# Patient Record
Sex: Male | Born: 1955 | Race: White | Hispanic: No | Smoking: Former smoker
Health system: Southern US, Community
[De-identification: ages and names within clinical notes are randomized; demographics above are authoritative.]

## PROBLEM LIST (undated history)

## (undated) DIAGNOSIS — K219 Gastro-esophageal reflux disease without esophagitis: Secondary | ICD-10-CM

## (undated) DIAGNOSIS — J189 Pneumonia, unspecified organism: Secondary | ICD-10-CM

## (undated) DIAGNOSIS — J45909 Unspecified asthma, uncomplicated: Secondary | ICD-10-CM

## (undated) DIAGNOSIS — M1990 Unspecified osteoarthritis, unspecified site: Secondary | ICD-10-CM

## (undated) DIAGNOSIS — E119 Type 2 diabetes mellitus without complications: Secondary | ICD-10-CM

## (undated) DIAGNOSIS — IMO0002 Reserved for concepts with insufficient information to code with codable children: Secondary | ICD-10-CM

## (undated) DIAGNOSIS — I10 Essential (primary) hypertension: Secondary | ICD-10-CM

## (undated) DIAGNOSIS — E1140 Type 2 diabetes mellitus with diabetic neuropathy, unspecified: Secondary | ICD-10-CM

## (undated) DIAGNOSIS — F419 Anxiety disorder, unspecified: Secondary | ICD-10-CM

## (undated) DIAGNOSIS — J449 Chronic obstructive pulmonary disease, unspecified: Secondary | ICD-10-CM

## (undated) DIAGNOSIS — Z8719 Personal history of other diseases of the digestive system: Secondary | ICD-10-CM

## (undated) DIAGNOSIS — F32A Depression, unspecified: Secondary | ICD-10-CM

## (undated) DIAGNOSIS — T7840XA Allergy, unspecified, initial encounter: Secondary | ICD-10-CM

## (undated) DIAGNOSIS — G709 Myoneural disorder, unspecified: Secondary | ICD-10-CM

## (undated) HISTORY — DX: Gastro-esophageal reflux disease without esophagitis: K21.9

## (undated) HISTORY — PX: HERNIA REPAIR: SHX51

## (undated) HISTORY — DX: Allergy, unspecified, initial encounter: T78.40XA

## (undated) HISTORY — PX: NEVUS EXCISION: SHX2090

## (undated) HISTORY — DX: Depression, unspecified: F32.A

## (undated) HISTORY — DX: Unspecified asthma, uncomplicated: J45.909

## (undated) HISTORY — PX: ANKLE SURGERY: SHX546

## (undated) HISTORY — PX: TUBAL LIGATION: SHX77

## (undated) HISTORY — PX: SPINE SURGERY: SHX786

## (undated) HISTORY — PX: APPENDECTOMY: SHX54

## (undated) HISTORY — DX: Chronic obstructive pulmonary disease, unspecified: J44.9

## (undated) HISTORY — DX: Unspecified osteoarthritis, unspecified site: M19.90

## (undated) HISTORY — DX: Anxiety disorder, unspecified: F41.9

## (undated) HISTORY — DX: Reserved for concepts with insufficient information to code with codable children: IMO0002

## (undated) HISTORY — DX: Myoneural disorder, unspecified: G70.9

## (undated) HISTORY — PX: COLON SURGERY: SHX602

## (undated) HISTORY — DX: Type 2 diabetes mellitus without complications: E11.9

## (undated) HISTORY — PX: TOOTH EXTRACTION: SUR596

## (undated) HISTORY — PX: CHOLECYSTECTOMY: SHX55

---

## 1993-04-23 HISTORY — PX: CHOLECYSTECTOMY: SHX55

## 1994-04-23 HISTORY — PX: VASECTOMY: SHX75

## 2001-12-22 HISTORY — PX: SHOULDER SURGERY: SHX246

## 2001-12-22 HISTORY — PX: ROTATOR CUFF REPAIR: SHX139

## 2002-01-21 HISTORY — PX: GANGLION CYST EXCISION: SHX1691

## 2002-07-23 HISTORY — PX: COLONOSCOPY: SHX174

## 2003-04-24 HISTORY — PX: COLON RESECTION: SHX5231

## 2012-09-16 DIAGNOSIS — R3129 Other microscopic hematuria: Secondary | ICD-10-CM | POA: Insufficient documentation

## 2013-12-16 ENCOUNTER — Other Ambulatory Visit: Payer: Self-pay | Admitting: Neurological Surgery

## 2013-12-16 DIAGNOSIS — Z9889 Other specified postprocedural states: Secondary | ICD-10-CM

## 2013-12-16 DIAGNOSIS — M545 Low back pain: Secondary | ICD-10-CM

## 2013-12-16 DIAGNOSIS — M47812 Spondylosis without myelopathy or radiculopathy, cervical region: Secondary | ICD-10-CM

## 2013-12-23 ENCOUNTER — Ambulatory Visit (INDEPENDENT_AMBULATORY_CARE_PROVIDER_SITE_OTHER): Payer: Medicare Other

## 2013-12-23 DIAGNOSIS — M47812 Spondylosis without myelopathy or radiculopathy, cervical region: Secondary | ICD-10-CM

## 2013-12-23 DIAGNOSIS — Z9889 Other specified postprocedural states: Secondary | ICD-10-CM

## 2013-12-23 DIAGNOSIS — M4802 Spinal stenosis, cervical region: Secondary | ICD-10-CM

## 2013-12-23 DIAGNOSIS — M5137 Other intervertebral disc degeneration, lumbosacral region: Secondary | ICD-10-CM

## 2013-12-23 DIAGNOSIS — M5126 Other intervertebral disc displacement, lumbar region: Secondary | ICD-10-CM

## 2013-12-23 DIAGNOSIS — M5380 Other specified dorsopathies, site unspecified: Secondary | ICD-10-CM

## 2013-12-23 DIAGNOSIS — M545 Low back pain: Secondary | ICD-10-CM

## 2014-01-21 HISTORY — PX: NECK SURGERY: SHX720

## 2014-03-09 ENCOUNTER — Other Ambulatory Visit: Payer: Self-pay | Admitting: Neurological Surgery

## 2014-03-09 DIAGNOSIS — M4802 Spinal stenosis, cervical region: Secondary | ICD-10-CM

## 2014-03-23 ENCOUNTER — Ambulatory Visit (INDEPENDENT_AMBULATORY_CARE_PROVIDER_SITE_OTHER): Payer: Medicare Other

## 2014-03-23 DIAGNOSIS — Z981 Arthrodesis status: Secondary | ICD-10-CM

## 2014-03-23 DIAGNOSIS — M4802 Spinal stenosis, cervical region: Secondary | ICD-10-CM

## 2014-06-29 ENCOUNTER — Other Ambulatory Visit: Payer: Self-pay | Admitting: Neurological Surgery

## 2014-06-29 DIAGNOSIS — Z981 Arthrodesis status: Secondary | ICD-10-CM

## 2014-12-04 IMAGING — CR DG CERVICAL SPINE 1V
1 series · 1 of 1 positions shown · non-contrast
Comparison: MR Done of 12/23/2013

CLINICAL DATA: Cervical spine fusion, followup

EXAM:
DG CERVICAL SPINE - 1 VIEW

[view not recorded]
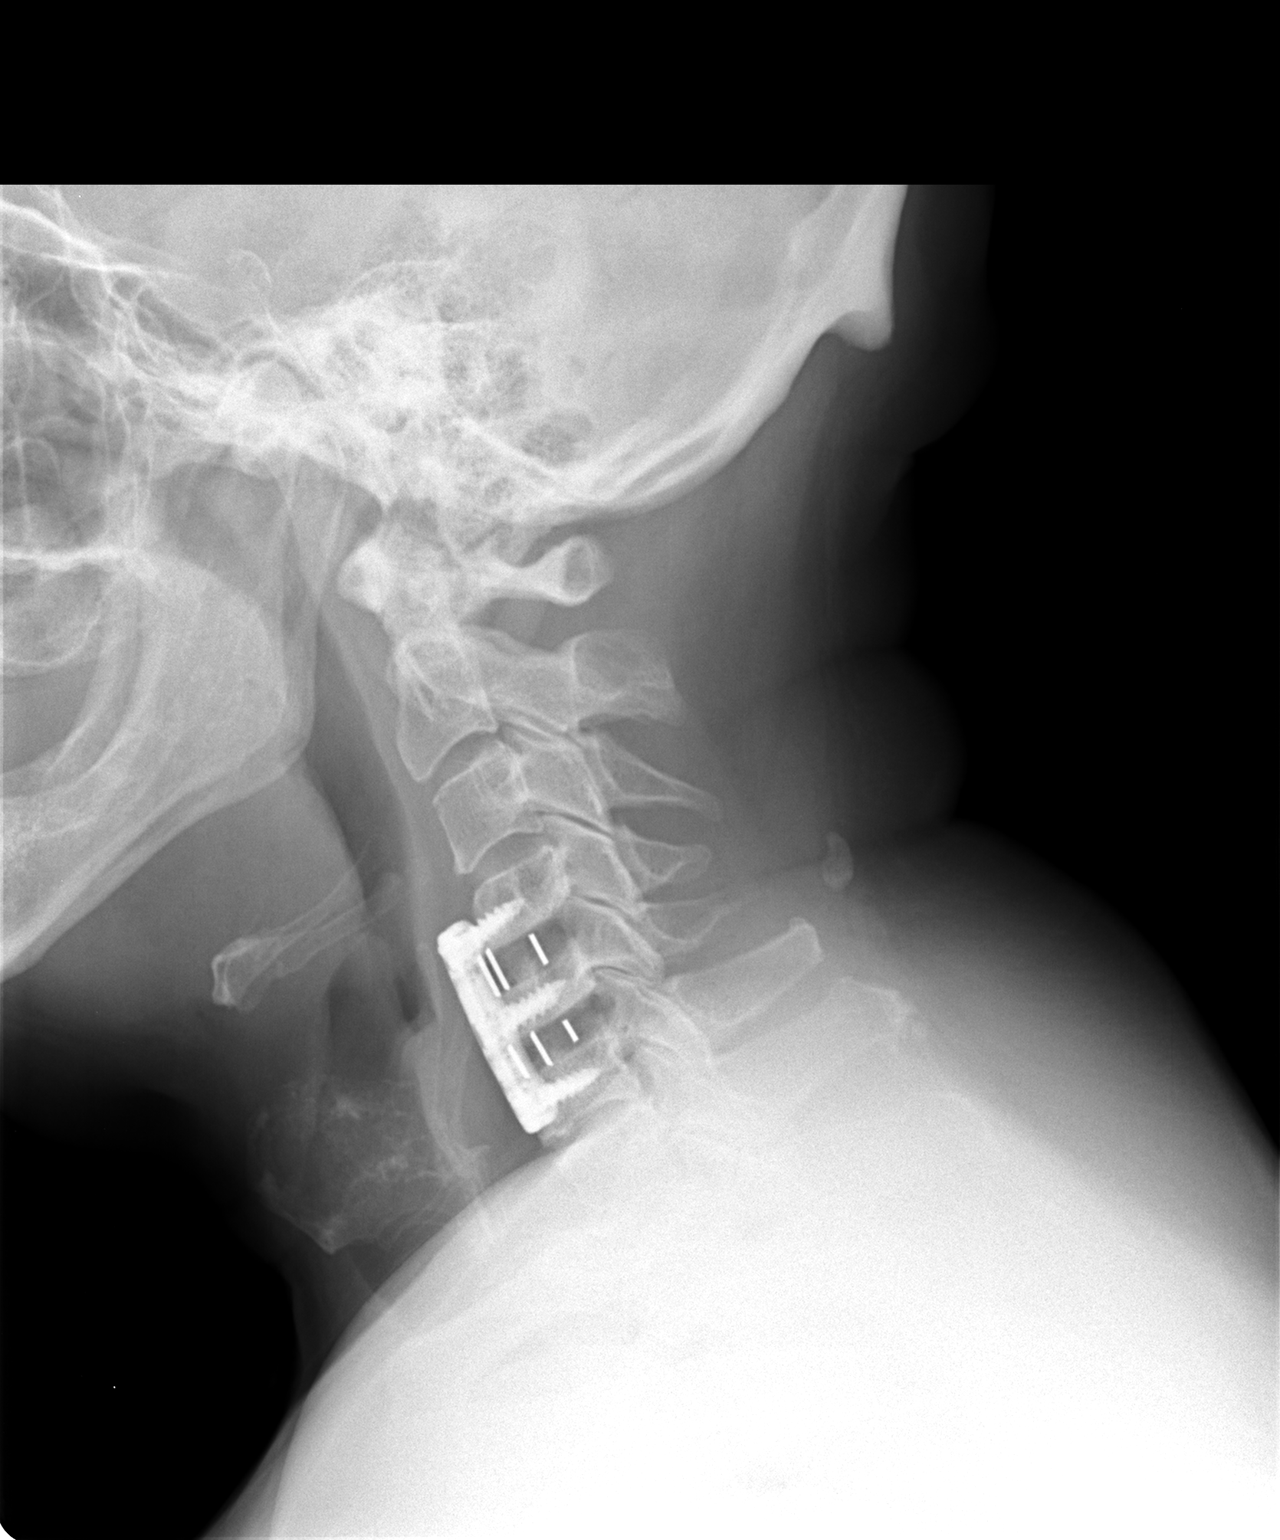

[1 of 1 positions shown; findings below may reference images not displayed]

FINDINGS: The cervical vertebrae are straightened in alignment. Anterior
fusion has been performed at C4-5 and C5-6 with interbody fusion
plugs placed at those levels. No significant prevertebral soft
tissue swelling is seen. There is mild degenerative disc disease at
C6-7.
IMPRESSION: Anterior fusion from C4-C6 with straightened alignment. No acute
abnormality.

## 2016-01-22 HISTORY — PX: CERVICAL FUSION: SHX112

## 2016-02-27 DIAGNOSIS — H18529 Epithelial (juvenile) corneal dystrophy, unspecified eye: Secondary | ICD-10-CM | POA: Insufficient documentation

## 2016-04-23 HISTORY — PX: SPINE SURGERY: SHX786

## 2017-01-21 HISTORY — PX: APPENDECTOMY: SHX54

## 2017-01-29 DIAGNOSIS — K3532 Acute appendicitis with perforation and localized peritonitis, without abscess: Secondary | ICD-10-CM | POA: Insufficient documentation

## 2017-07-03 DIAGNOSIS — S83242A Other tear of medial meniscus, current injury, left knee, initial encounter: Secondary | ICD-10-CM | POA: Insufficient documentation

## 2017-07-22 HISTORY — PX: KNEE ARTHROSCOPY WITH MEDIAL MENISECTOMY: SHX5651

## 2017-07-26 DIAGNOSIS — Z9889 Other specified postprocedural states: Secondary | ICD-10-CM | POA: Insufficient documentation

## 2021-05-31 DIAGNOSIS — D696 Thrombocytopenia, unspecified: Secondary | ICD-10-CM | POA: Diagnosis not present

## 2021-05-31 DIAGNOSIS — E1169 Type 2 diabetes mellitus with other specified complication: Secondary | ICD-10-CM | POA: Diagnosis not present

## 2021-05-31 DIAGNOSIS — E782 Mixed hyperlipidemia: Secondary | ICD-10-CM | POA: Diagnosis not present

## 2021-08-28 DIAGNOSIS — Z888 Allergy status to other drugs, medicaments and biological substances status: Secondary | ICD-10-CM | POA: Diagnosis not present

## 2021-08-28 DIAGNOSIS — Z87891 Personal history of nicotine dependence: Secondary | ICD-10-CM | POA: Diagnosis not present

## 2021-08-28 DIAGNOSIS — K922 Gastrointestinal hemorrhage, unspecified: Secondary | ICD-10-CM | POA: Diagnosis not present

## 2021-08-28 DIAGNOSIS — E1169 Type 2 diabetes mellitus with other specified complication: Secondary | ICD-10-CM | POA: Diagnosis not present

## 2021-08-28 DIAGNOSIS — R7989 Other specified abnormal findings of blood chemistry: Secondary | ICD-10-CM | POA: Diagnosis not present

## 2021-08-28 DIAGNOSIS — R0602 Shortness of breath: Secondary | ICD-10-CM | POA: Diagnosis not present

## 2021-08-28 DIAGNOSIS — Z880 Allergy status to penicillin: Secondary | ICD-10-CM | POA: Diagnosis not present

## 2021-08-28 DIAGNOSIS — K625 Hemorrhage of anus and rectum: Secondary | ICD-10-CM | POA: Diagnosis not present

## 2021-11-08 DIAGNOSIS — E782 Mixed hyperlipidemia: Secondary | ICD-10-CM | POA: Diagnosis not present

## 2021-11-08 DIAGNOSIS — E1169 Type 2 diabetes mellitus with other specified complication: Secondary | ICD-10-CM | POA: Diagnosis not present

## 2021-11-08 DIAGNOSIS — M5442 Lumbago with sciatica, left side: Secondary | ICD-10-CM | POA: Diagnosis not present

## 2021-11-08 DIAGNOSIS — I129 Hypertensive chronic kidney disease with stage 1 through stage 4 chronic kidney disease, or unspecified chronic kidney disease: Secondary | ICD-10-CM | POA: Diagnosis not present

## 2021-11-10 DIAGNOSIS — S61112A Laceration without foreign body of left thumb with damage to nail, initial encounter: Secondary | ICD-10-CM | POA: Diagnosis not present

## 2021-11-10 DIAGNOSIS — Z23 Encounter for immunization: Secondary | ICD-10-CM | POA: Diagnosis not present

## 2021-11-29 DIAGNOSIS — F17211 Nicotine dependence, cigarettes, in remission: Secondary | ICD-10-CM | POA: Diagnosis not present

## 2021-11-29 DIAGNOSIS — J4520 Mild intermittent asthma, uncomplicated: Secondary | ICD-10-CM | POA: Diagnosis not present

## 2021-11-29 DIAGNOSIS — Z0001 Encounter for general adult medical examination with abnormal findings: Secondary | ICD-10-CM | POA: Diagnosis not present

## 2021-11-29 DIAGNOSIS — I129 Hypertensive chronic kidney disease with stage 1 through stage 4 chronic kidney disease, or unspecified chronic kidney disease: Secondary | ICD-10-CM | POA: Diagnosis not present

## 2021-12-01 DIAGNOSIS — Z1211 Encounter for screening for malignant neoplasm of colon: Secondary | ICD-10-CM | POA: Diagnosis not present

## 2021-12-14 DIAGNOSIS — Z87891 Personal history of nicotine dependence: Secondary | ICD-10-CM | POA: Diagnosis not present

## 2022-01-23 DIAGNOSIS — I129 Hypertensive chronic kidney disease with stage 1 through stage 4 chronic kidney disease, or unspecified chronic kidney disease: Secondary | ICD-10-CM | POA: Diagnosis not present

## 2022-01-23 DIAGNOSIS — J4520 Mild intermittent asthma, uncomplicated: Secondary | ICD-10-CM | POA: Diagnosis not present

## 2022-01-23 DIAGNOSIS — J209 Acute bronchitis, unspecified: Secondary | ICD-10-CM | POA: Diagnosis not present

## 2022-01-23 DIAGNOSIS — E1169 Type 2 diabetes mellitus with other specified complication: Secondary | ICD-10-CM | POA: Diagnosis not present

## 2022-01-25 DIAGNOSIS — Z8659 Personal history of other mental and behavioral disorders: Secondary | ICD-10-CM | POA: Diagnosis not present

## 2022-01-25 DIAGNOSIS — Z79899 Other long term (current) drug therapy: Secondary | ICD-10-CM | POA: Diagnosis not present

## 2022-01-25 DIAGNOSIS — R7989 Other specified abnormal findings of blood chemistry: Secondary | ICD-10-CM | POA: Diagnosis not present

## 2022-01-25 DIAGNOSIS — Z8249 Family history of ischemic heart disease and other diseases of the circulatory system: Secondary | ICD-10-CM | POA: Diagnosis not present

## 2022-01-25 DIAGNOSIS — Z87891 Personal history of nicotine dependence: Secondary | ICD-10-CM | POA: Diagnosis not present

## 2022-01-25 DIAGNOSIS — Z8679 Personal history of other diseases of the circulatory system: Secondary | ICD-10-CM | POA: Diagnosis not present

## 2022-01-25 DIAGNOSIS — F410 Panic disorder [episodic paroxysmal anxiety] without agoraphobia: Secondary | ICD-10-CM | POA: Diagnosis not present

## 2022-01-25 DIAGNOSIS — R072 Precordial pain: Secondary | ICD-10-CM | POA: Diagnosis not present

## 2022-01-25 DIAGNOSIS — Z8639 Personal history of other endocrine, nutritional and metabolic disease: Secondary | ICD-10-CM | POA: Diagnosis not present

## 2022-01-27 DIAGNOSIS — R000 Tachycardia, unspecified: Secondary | ICD-10-CM | POA: Diagnosis not present

## 2022-01-27 DIAGNOSIS — R079 Chest pain, unspecified: Secondary | ICD-10-CM | POA: Diagnosis not present

## 2022-01-29 DIAGNOSIS — G629 Polyneuropathy, unspecified: Secondary | ICD-10-CM | POA: Diagnosis not present

## 2022-01-29 DIAGNOSIS — F419 Anxiety disorder, unspecified: Secondary | ICD-10-CM | POA: Diagnosis not present

## 2022-01-29 DIAGNOSIS — F410 Panic disorder [episodic paroxysmal anxiety] without agoraphobia: Secondary | ICD-10-CM | POA: Diagnosis not present

## 2022-01-29 DIAGNOSIS — F32A Depression, unspecified: Secondary | ICD-10-CM | POA: Diagnosis not present

## 2022-03-02 DIAGNOSIS — Z23 Encounter for immunization: Secondary | ICD-10-CM | POA: Diagnosis not present

## 2022-03-02 DIAGNOSIS — R739 Hyperglycemia, unspecified: Secondary | ICD-10-CM | POA: Diagnosis not present

## 2022-03-02 DIAGNOSIS — F419 Anxiety disorder, unspecified: Secondary | ICD-10-CM | POA: Diagnosis not present

## 2022-03-02 DIAGNOSIS — F32A Depression, unspecified: Secondary | ICD-10-CM | POA: Diagnosis not present

## 2022-04-23 HISTORY — PX: INGUINAL HERNIA REPAIR: SHX194

## 2022-04-25 DIAGNOSIS — F32A Depression, unspecified: Secondary | ICD-10-CM | POA: Diagnosis not present

## 2022-04-25 DIAGNOSIS — F419 Anxiety disorder, unspecified: Secondary | ICD-10-CM | POA: Diagnosis not present

## 2022-04-25 DIAGNOSIS — G629 Polyneuropathy, unspecified: Secondary | ICD-10-CM | POA: Diagnosis not present

## 2022-04-25 DIAGNOSIS — K4090 Unilateral inguinal hernia, without obstruction or gangrene, not specified as recurrent: Secondary | ICD-10-CM | POA: Diagnosis not present

## 2022-04-25 DIAGNOSIS — R350 Frequency of micturition: Secondary | ICD-10-CM | POA: Diagnosis not present

## 2022-04-25 DIAGNOSIS — F410 Panic disorder [episodic paroxysmal anxiety] without agoraphobia: Secondary | ICD-10-CM | POA: Diagnosis not present

## 2022-04-25 DIAGNOSIS — R739 Hyperglycemia, unspecified: Secondary | ICD-10-CM | POA: Diagnosis not present

## 2022-05-04 DIAGNOSIS — K4090 Unilateral inguinal hernia, without obstruction or gangrene, not specified as recurrent: Secondary | ICD-10-CM | POA: Diagnosis not present

## 2022-05-04 DIAGNOSIS — K4020 Bilateral inguinal hernia, without obstruction or gangrene, not specified as recurrent: Secondary | ICD-10-CM | POA: Diagnosis not present

## 2022-05-04 DIAGNOSIS — Z9049 Acquired absence of other specified parts of digestive tract: Secondary | ICD-10-CM | POA: Diagnosis not present

## 2022-05-04 DIAGNOSIS — K769 Liver disease, unspecified: Secondary | ICD-10-CM | POA: Diagnosis not present

## 2022-05-15 DIAGNOSIS — K4020 Bilateral inguinal hernia, without obstruction or gangrene, not specified as recurrent: Secondary | ICD-10-CM | POA: Diagnosis not present

## 2022-06-04 DIAGNOSIS — E119 Type 2 diabetes mellitus without complications: Secondary | ICD-10-CM | POA: Diagnosis not present

## 2022-06-04 DIAGNOSIS — K4090 Unilateral inguinal hernia, without obstruction or gangrene, not specified as recurrent: Secondary | ICD-10-CM | POA: Diagnosis not present

## 2022-06-04 DIAGNOSIS — F419 Anxiety disorder, unspecified: Secondary | ICD-10-CM | POA: Diagnosis not present

## 2022-06-04 DIAGNOSIS — J45909 Unspecified asthma, uncomplicated: Secondary | ICD-10-CM | POA: Diagnosis not present

## 2022-06-04 DIAGNOSIS — Z87891 Personal history of nicotine dependence: Secondary | ICD-10-CM | POA: Diagnosis not present

## 2022-06-04 DIAGNOSIS — Z79899 Other long term (current) drug therapy: Secondary | ICD-10-CM | POA: Diagnosis not present

## 2022-06-04 DIAGNOSIS — K4000 Bilateral inguinal hernia, with obstruction, without gangrene, not specified as recurrent: Secondary | ICD-10-CM | POA: Diagnosis not present

## 2022-06-04 DIAGNOSIS — Z880 Allergy status to penicillin: Secondary | ICD-10-CM | POA: Diagnosis not present

## 2022-06-04 DIAGNOSIS — M1990 Unspecified osteoarthritis, unspecified site: Secondary | ICD-10-CM | POA: Diagnosis not present

## 2022-06-04 DIAGNOSIS — E785 Hyperlipidemia, unspecified: Secondary | ICD-10-CM | POA: Diagnosis not present

## 2022-06-04 DIAGNOSIS — K219 Gastro-esophageal reflux disease without esophagitis: Secondary | ICD-10-CM | POA: Diagnosis not present

## 2022-06-04 DIAGNOSIS — Z7984 Long term (current) use of oral hypoglycemic drugs: Secondary | ICD-10-CM | POA: Diagnosis not present

## 2022-06-04 DIAGNOSIS — K4020 Bilateral inguinal hernia, without obstruction or gangrene, not specified as recurrent: Secondary | ICD-10-CM | POA: Diagnosis not present

## 2022-06-04 DIAGNOSIS — Z7982 Long term (current) use of aspirin: Secondary | ICD-10-CM | POA: Diagnosis not present

## 2022-11-09 DIAGNOSIS — L509 Urticaria, unspecified: Secondary | ICD-10-CM | POA: Diagnosis not present

## 2022-11-09 DIAGNOSIS — T7840XA Allergy, unspecified, initial encounter: Secondary | ICD-10-CM | POA: Diagnosis not present

## 2022-12-05 ENCOUNTER — Ambulatory Visit: Payer: Medicare Other | Admitting: Family Medicine

## 2022-12-05 ENCOUNTER — Encounter: Payer: Self-pay | Admitting: Family Medicine

## 2022-12-05 VITALS — BP 149/99 | HR 99 | Resp 18 | Ht 71.0 in | Wt 193.8 lb

## 2022-12-05 DIAGNOSIS — K219 Gastro-esophageal reflux disease without esophagitis: Secondary | ICD-10-CM

## 2022-12-05 DIAGNOSIS — F411 Generalized anxiety disorder: Secondary | ICD-10-CM

## 2022-12-05 DIAGNOSIS — M47816 Spondylosis without myelopathy or radiculopathy, lumbar region: Secondary | ICD-10-CM | POA: Insufficient documentation

## 2022-12-05 DIAGNOSIS — E119 Type 2 diabetes mellitus without complications: Secondary | ICD-10-CM

## 2022-12-05 DIAGNOSIS — M5126 Other intervertebral disc displacement, lumbar region: Secondary | ICD-10-CM

## 2022-12-05 DIAGNOSIS — R062 Wheezing: Secondary | ICD-10-CM | POA: Insufficient documentation

## 2022-12-05 DIAGNOSIS — Z8679 Personal history of other diseases of the circulatory system: Secondary | ICD-10-CM

## 2022-12-05 DIAGNOSIS — E782 Mixed hyperlipidemia: Secondary | ICD-10-CM | POA: Diagnosis not present

## 2022-12-05 DIAGNOSIS — F321 Major depressive disorder, single episode, moderate: Secondary | ICD-10-CM

## 2022-12-05 LAB — POCT GLYCOSYLATED HEMOGLOBIN (HGB A1C): Hemoglobin A1C: 6.3 % — AB (ref 4.0–5.6)

## 2022-12-05 MED ORDER — BUPROPION HCL ER (XL) 150 MG PO TB24: 150.0000 mg | ORAL_TABLET | Freq: Every day | ORAL | 1 refills | Status: DC

## 2022-12-05 NOTE — Assessment & Plan Note (Signed)
-  discussed getting off klonipin and provided pt with taper protocol. Will add wellbutrin to help with the fatigue aspect of depression

## 2022-12-05 NOTE — Assessment & Plan Note (Signed)
-   poc A1c 6.3 will continue metformin 500mg  daily

## 2022-12-05 NOTE — Assessment & Plan Note (Signed)
On omeprazole and doing well

## 2022-12-05 NOTE — Assessment & Plan Note (Signed)
Pt is past smoker - is interested in ct lung cancer screen however would like to discuss more at next visit - wheezing present. Pt has inhalers but does not use them because he is unclear if they actually work.  Have gone ahead and set up patient for nurse visit for spirometry so we can better figure out what his lung function looks like.

## 2022-12-05 NOTE — Progress Notes (Signed)
Established patient visit   Patient: Marcus Brock   DOB: 12-Sep-1955   68 y.o. Male  MRN: [id number] Visit Date: 12/05/2022  Today's healthcare provider: Charlton Amor, DO   Chief Complaint  Patient presents with   New Patient (Initial Visit)    Establish care     SUBJECTIVE    Chief Complaint  Patient presents with   New Patient (Initial Visit)    Establish care    HPI HPI     New Patient (Initial Visit)    Additional comments: Establish care       Last edited by Roselyn Reef, CMA on 12/05/2022  2:44 PM.      Pt presents to establish care. 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  Respiratory:  Negative for cough and shortness of breath.   Cardiovascular:  Negative for chest pain.  Gastrointestinal:  Negative for abdominal pain.  Genitourinary:  Negative for difficulty urinating.       Current Meds  Medication Sig   acetaminophen (TYLENOL) 650 MG CR tablet Take by mouth.   albuterol (VENTOLIN HFA) 108 (90 Base) MCG/ACT inhaler Inhale into the lungs.   ALPRAZolam (XANAX) 1 MG tablet    aspirin EC 81 MG tablet Take by mouth.   atorvastatin (LIPITOR) 40 MG tablet Take 1 tablet by mouth at bedtime.   Azelastine HCl 137 MCG/SPRAY SOLN ONE SPRAY BY BOTH NOSTRILS ROUTE 2 (TWO) TIMES DAILY. USE IN EACH  NOSTRIL AS DIRECTED   buPROPion (WELLBUTRIN XL) 150 MG 24 hr tablet Take 1 tablet (150 mg total) by mouth daily.   Cholecalciferol (VITAMIN D3) 50 MCG (2000 UT) capsule Take by mouth.   clonazePAM (KLONOPIN) 0.5 MG tablet Take by mouth.   Docusate Sodium 100 MG capsule Take by mouth.   DULoxetine (CYMBALTA) 60 MG capsule Take 60 mg by mouth daily.   EPINEPHrine 0.3 mg/0.3 mL IJ SOAJ injection Inject into the muscle.   famotidine (PEPCID) 20 MG tablet Take 20 mg by mouth 2 (two) times daily.   gabapentin (NEURONTIN) 100 MG capsule Take 1 capsule by mouth daily.   loratadine (CLARITIN) 10 MG tablet Take 1 tablet (10 mg total) by mouth daily for 14 days.   meloxicam (MOBIC) 15 MG tablet Take 15 mg by mouth daily.   metFORMIN (GLUCOPHAGE) 500 MG tablet Take by mouth.   tamsulosin (FLOMAX) 0.4 MG CAPS capsule Take by mouth.   VITAMIN D PO Take by mouth.    OBJECTIVE    BP (!) 149/99 (BP Location: Right Arm, Patient Position: Sitting, Cuff Size: Large)   Pulse 99   Resp 18   Ht 5\' 11"  (1.803 m)   Wt 193 lb 12 oz (87.9  kg)   SpO2 98%   BMI 27.02 kg/m   Physical Exam Vitals and nursing note reviewed.  Constitutional:      General: He is not in acute distress.    Appearance: Normal appearance.  HENT:     Head: Normocephalic and atraumatic.     Right Ear: External ear normal.     Left Ear: External ear normal.     Nose: Nose normal.  Eyes:     Conjunctiva/sclera: Conjunctivae normal.  Cardiovascular:     Rate and Rhythm: Normal rate and regular rhythm.  Pulmonary:     Effort: Pulmonary effort is normal.     Breath sounds: Normal breath sounds.  Neurological:     General: No focal deficit present.     Mental Status: He is alert and oriented to person, place, and time.  Psychiatric:        Mood and Affect: Mood normal.        Behavior: Behavior normal.        Thought Content: Thought content normal.        Judgment: Judgment normal.          ASSESSMENT & PLAN     Problem List Items Addressed This Visit       Digestive   Gastroesophageal reflux disease without esophagitis    On omeprazole and doing well       Relevant Medications   Docusate Sodium 100 MG capsule   famotidine (PEPCID) 20 MG tablet     Endocrine   Type 2 diabetes mellitus without complication, without long-term current use of insulin (HCC) - Primary    - poc A1c 6.3 will continue metformin 500mg  daily       Relevant Medications   aspirin EC 81 MG tablet   atorvastatin (LIPITOR) 40 MG tablet   metFORMIN (GLUCOPHAGE) 500 MG tablet   Other Relevant Orders   POCT HgB A1C (Completed)     Musculoskeletal and Integument   Lumbar disc herniation    Pt has hx and is having significant pain, will refer to ortho spine for further eval and recommendations      Relevant Orders   Ambulatory referral to Orthopedics     Other   History of CAD (coronary artery disease)   Mixed hyperlipidemia   Relevant Medications   aspirin EC 81 MG tablet   atorvastatin (LIPITOR) 40 MG tablet   EPINEPHrine 0.3 mg/0.3 mL IJ SOAJ injection   GAD (generalized anxiety disorder)   Relevant Medications   ALPRAZolam (XANAX) 1 MG tablet   DULoxetine (CYMBALTA) 60 MG capsule   buPROPion (WELLBUTRIN XL) 150 MG 24 hr tablet   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 episode (HCC)    -discussed getting off klonipin and provided pt with taper protocol. Will add wellbutrin to help with the fatigue aspect of depression       Relevant Medications   ALPRAZolam (XANAX) 1 MG tablet   DULoxetine (CYMBALTA) 60 MG capsule   buPROPion (WELLBUTRIN XL) 150 MG 24 hr tablet   Wheezing    Pt is past smoker - is interested in ct lung cancer screen however would like to discuss more at next visit - wheezing present. Pt has inhalers but does not use them because he is unclear if they actually work.  Have gone ahead and set up patient for nurse visit for spirometry so we can better figure out  what his lung function looks like.  Return in about 4 weeks (around 01/02/2023).      Meds ordered this encounter  Medications   buPROPion (WELLBUTRIN XL) 150 MG 24 hr tablet    Sig: Take 1 tablet (150 mg total) by mouth daily.    Dispense:  30 tablet    Refill:  1    Orders Placed This Encounter  Procedures   Ambulatory referral to Orthopedics    Referral Priority:   Routine    Referral Type:   Consultation    Referred to Provider:   Estill Bamberg, MD    Number of Visits Requested:   1   POCT HgB A1C     Charlton Amor, DO  [facility] at [facility] [phone] (phone) [phone] (fax)  [facility]

## 2022-12-05 NOTE — Assessment & Plan Note (Signed)
Pt has hx and is having significant pain, will refer to ortho spine for further eval and recommendations

## 2022-12-05 NOTE — Patient Instructions (Signed)
Taper off klonipin   One tab every other day for two weeks One tab every three days for two weeks  Then stop

## 2022-12-07 ENCOUNTER — Encounter: Payer: Self-pay | Admitting: Family Medicine

## 2022-12-27 ENCOUNTER — Other Ambulatory Visit: Payer: Self-pay | Admitting: Family Medicine

## 2023-01-02 ENCOUNTER — Encounter: Payer: Self-pay | Admitting: Family Medicine

## 2023-01-02 ENCOUNTER — Ambulatory Visit (INDEPENDENT_AMBULATORY_CARE_PROVIDER_SITE_OTHER): Payer: Medicare Other | Admitting: Family Medicine

## 2023-01-02 VITALS — BP 134/80 | HR 89 | Ht 71.0 in | Wt 193.0 lb

## 2023-01-02 DIAGNOSIS — J449 Chronic obstructive pulmonary disease, unspecified: Secondary | ICD-10-CM | POA: Insufficient documentation

## 2023-01-02 DIAGNOSIS — R062 Wheezing: Secondary | ICD-10-CM | POA: Diagnosis not present

## 2023-01-02 DIAGNOSIS — Z23 Encounter for immunization: Secondary | ICD-10-CM | POA: Diagnosis not present

## 2023-01-02 MED ORDER — BREZTRI AEROSPHERE 160-9-4.8 MCG/ACT IN AERO: 2.0000 | INHALATION_SPRAY | Freq: Two times a day (BID) | RESPIRATORY_TRACT | 11 refills | Status: DC

## 2023-01-02 MED ORDER — ALBUTEROL SULFATE HFA 108 (90 BASE) MCG/ACT IN AERS: 2.0000 | INHALATION_SPRAY | Freq: Four times a day (QID) | RESPIRATORY_TRACT | 0 refills | Status: DC | PRN

## 2023-01-02 MED ORDER — ALBUTEROL SULFATE (2.5 MG/3ML) 0.083% IN NEBU
2.5000 mg | INHALATION_SOLUTION | Freq: Once | RESPIRATORY_TRACT | Status: AC
Start: 2023-01-02 — End: 2023-01-02
  Administered 2023-01-02: 2.5 mg via RESPIRATORY_TRACT

## 2023-01-02 NOTE — Patient Instructions (Signed)
Referral, clinical notes, demographics,and copies of insurance cards have been faxed to [facility] at [phone]. Office will contact patient to schedule referral appointment.

## 2023-01-02 NOTE — Assessment & Plan Note (Signed)
-   spirometry testing done  - FEV1 66% - no change after bronchodilator usage - will go ahead and start patient on breztri

## 2023-01-02 NOTE — Progress Notes (Signed)
Established patient visit   Patient: Marcus Brock   DOB: 07-Sep-1955   67 y.o. Male  MRN: [id number] Visit Date: 01/02/2023  Today's healthcare provider: Charlton Amor, DO   Chief Complaint  Patient presents with   spirometry    Nurse visit spirometry for wheezing.     SUBJECTIVE    Chief Complaint  Patient presents with   spirometry    Nurse visit spirometry for wheezing.    HPI HPI     spirometry    Additional comments: Nurse visit spirometry for wheezing.       Last edited by Elizabeth Palau, LPN on 05/23/8655  2:31 PM.      Pt presents post spirometry for eval.   Review of Systems  Constitutional:  Negative for activity change, fatigue and fever.  Respiratory:  Negative for cough and shortness of breath.   Cardiovascular:  Negative for chest pain.  Gastrointestinal:  Negative for abdominal pain.  Genitourinary:  Negative for difficulty urinating.       Current Meds  Medication Sig   acetaminophen (TYLENOL) 650 MG CR tablet Take by mouth.   albuterol (VENTOLIN HFA) 108 (90 Base) MCG/ACT inhaler Inhale into the lungs.   albuterol (VENTOLIN HFA) 108 (90 Base) MCG/ACT inhaler Inhale 2 puffs into the lungs every 6 (six) hours as needed for wheezing or shortness of breath.   aspirin EC 81 MG tablet Take by mouth.   atorvastatin (LIPITOR) 40 MG tablet Take 1 tablet by mouth at bedtime.   Azelastine HCl 137 MCG/SPRAY SOLN ONE SPRAY BY BOTH NOSTRILS ROUTE 2 (TWO) TIMES DAILY. USE IN EACH NOSTRIL AS DIRECTED   Budeson-Glycopyrrol-Formoterol (BREZTRI AEROSPHERE) 160-9-4.8 MCG/ACT AERO Inhale 2 puffs into the lungs 2 (two) times daily.   buPROPion (WELLBUTRIN XL) 150 MG 24 hr tablet TAKE 1 TABLET BY MOUTH EVERY DAY   Cholecalciferol (VITAMIN D3) 50 MCG (2000 UT) capsule Take by mouth.   Docusate Sodium 100 MG capsule Take by mouth.   DULoxetine (CYMBALTA) 60 MG capsule Take 60 mg by mouth daily.   EPINEPHrine 0.3 mg/0.3 mL IJ SOAJ injection Inject into the  muscle.   famotidine (PEPCID) 20 MG tablet Take 20 mg by mouth 2 (two) times daily.   gabapentin (NEURONTIN) 100 MG capsule Take 1 capsule by mouth daily.   meloxicam (MOBIC) 15 MG tablet Take 15 mg by mouth daily.   metFORMIN (GLUCOPHAGE) 500 MG tablet Take by mouth.   tamsulosin (FLOMAX) 0.4 MG CAPS capsule Take by mouth.   VITAMIN D PO Take by mouth.    OBJECTIVE    BP 134/80   Pulse 89   Ht 5\' 11"  (1.803 m)   SpO2 98%   BMI 27.02 kg/m   Physical Exam Vitals and nursing note reviewed.  Constitutional:      General: He is not in acute distress.    Appearance: Normal appearance.  HENT:     Head: Normocephalic and atraumatic.     Right Ear: External ear normal.     Left Ear: External ear normal.     Nose: Nose normal.  Eyes:     Conjunctiva/sclera: Conjunctivae normal.  Cardiovascular:     Rate and Rhythm: Normal rate and regular rhythm.  Pulmonary:     Effort: Pulmonary effort is normal.     Breath sounds: Normal breath sounds.  Neurological:     General: No focal deficit present.     Mental Status: He is alert and oriented to  person, place, and time.  Psychiatric:        Mood and Affect: Mood normal.        Behavior: Behavior normal.        Thought Content: Thought content normal.        Judgment: Judgment normal.        ASSESSMENT & PLAN    Problem List Items Addressed This Visit       Respiratory   Chronic obstructive pulmonary disease (HCC) - Primary    - spirometry testing done  - FEV1 66% - no change after bronchodilator usage - will go ahead and start patient on breztri       Relevant Medications   Budeson-Glycopyrrol-Formoterol (BREZTRI AEROSPHERE) 160-9-4.8 MCG/ACT AERO   albuterol (VENTOLIN HFA) 108 (90 Base) MCG/ACT inhaler     Other   Wheezing   Other Visit Diagnoses     Encounter for immunization       Relevant Orders   Flu Vaccine Trivalent High Dose (Fluad) (Completed)       No follow-ups on file.      Meds ordered this  encounter  Medications   Budeson-Glycopyrrol-Formoterol (BREZTRI AEROSPHERE) 160-9-4.8 MCG/ACT AERO    Sig: Inhale 2 puffs into the lungs 2 (two) times daily.    Dispense:  1 g    Refill:  11   albuterol (VENTOLIN HFA) 108 (90 Base) MCG/ACT inhaler    Sig: Inhale 2 puffs into the lungs every 6 (six) hours as needed for wheezing or shortness of breath.    Dispense:  8 g    Refill:  0   albuterol (PROVENTIL) (2.5 MG/3ML) 0.083% nebulizer solution 2.5 mg    Orders Placed This Encounter  Procedures   Flu Vaccine Trivalent High Dose (Fluad)     Charlton Amor, DO  [facility] Medicine at [facility] [phone] (phone) [phone] (fax)  [facility]

## 2023-01-10 NOTE — Addendum Note (Signed)
Addended by: Chalmers Cater on: 01/10/2023 11:43 AM   Modules accepted: Orders

## 2023-01-14 ENCOUNTER — Encounter: Payer: Self-pay | Admitting: Family Medicine

## 2023-01-14 ENCOUNTER — Ambulatory Visit (INDEPENDENT_AMBULATORY_CARE_PROVIDER_SITE_OTHER): Payer: Medicare Other | Admitting: Family Medicine

## 2023-01-14 VITALS — BP 152/85 | HR 83 | Ht 71.0 in | Wt 194.5 lb

## 2023-01-14 DIAGNOSIS — E119 Type 2 diabetes mellitus without complications: Secondary | ICD-10-CM

## 2023-01-14 DIAGNOSIS — R3912 Poor urinary stream: Secondary | ICD-10-CM

## 2023-01-14 DIAGNOSIS — M4322 Fusion of spine, cervical region: Secondary | ICD-10-CM

## 2023-01-14 DIAGNOSIS — Z125 Encounter for screening for malignant neoplasm of prostate: Secondary | ICD-10-CM

## 2023-01-14 DIAGNOSIS — Z0000 Encounter for general adult medical examination without abnormal findings: Secondary | ICD-10-CM | POA: Diagnosis not present

## 2023-01-14 DIAGNOSIS — E559 Vitamin D deficiency, unspecified: Secondary | ICD-10-CM

## 2023-01-14 DIAGNOSIS — Z1159 Encounter for screening for other viral diseases: Secondary | ICD-10-CM

## 2023-01-14 DIAGNOSIS — Q761 Klippel-Feil syndrome: Secondary | ICD-10-CM | POA: Insufficient documentation

## 2023-01-14 DIAGNOSIS — G629 Polyneuropathy, unspecified: Secondary | ICD-10-CM | POA: Insufficient documentation

## 2023-01-14 MED ORDER — TAMSULOSIN HCL 0.4 MG PO CAPS: 0.4000 mg | ORAL_CAPSULE | Freq: Every day | ORAL | 1 refills | Status: DC

## 2023-01-14 MED ORDER — GABAPENTIN 300 MG PO CAPS: 300.0000 mg | ORAL_CAPSULE | Freq: Every day | ORAL | 1 refills | Status: DC

## 2023-01-14 NOTE — Assessment & Plan Note (Signed)
-   increased gabapentin to 300mg  at bedtime

## 2023-01-14 NOTE — Assessment & Plan Note (Signed)
-   pt currently on flomax 0.4mg  and doesn't notice a difference-has been on it for years. Says that when he ejaculates on flomax nothing comes out but when he doesn't take the flomax he can ejaculate  - says when he doesn't take the flomax he also notices prostate pain  - will refer to urology for further assessment

## 2023-01-14 NOTE — Progress Notes (Signed)
Established patient visit   Patient: Marcus Brock   DOB: 1955/06/27   67 y.o. Male  MRN: [id number] Visit Date: 01/14/2023  Today's healthcare provider: Charlton Amor, DO   Chief Complaint  Patient presents with   Annual Exam    SUBJECTIVE    Chief Complaint  Patient presents with   Annual Exam   HPI  Pt presents for physical.   DM: in mom and dad Heart problems: mom, dad-stroke, angina Cancer: dad had prostate cancer  Dad had some blood disorder with thick blood   Has concerns of weak stream.   Has concerns of diabetic neuropathy along with sciatica down right arm. Also has some left sided sciatica  Pt has hx of cervical fusion and wants a referral to [facility]    Review of Systems  Constitutional:  Negative for activity change, fatigue and fever.  Respiratory:  Negative for cough and shortness of breath.   Cardiovascular:  Negative for chest pain.  Gastrointestinal:  Negative for abdominal pain.  Genitourinary:  Negative for difficulty urinating.       Current Meds  Medication Sig   gabapentin (NEURONTIN) 300 MG capsule Take 1 capsule (300 mg total) by mouth at bedtime.    OBJECTIVE    BP (!) 152/85 (BP Location: Left Arm, Patient Position: Sitting, Cuff Size: Large)   Pulse 83   Ht 5\' 11"  (1.803 m)   Wt 194 lb 8 oz (88.2 kg)   SpO2 100%   BMI 27.13 kg/m   Physical Exam Vitals and nursing note reviewed.  Constitutional:      General: He is not in acute distress.    Appearance: Normal appearance.  HENT:     Head: Normocephalic and atraumatic.     Right Ear: Tympanic membrane, ear canal and external ear normal.     Left Ear: Tympanic membrane, ear canal and external ear normal.     Nose: Nose normal.  Eyes:     Conjunctiva/sclera: Conjunctivae normal.  Cardiovascular:     Rate and Rhythm: Normal rate and regular rhythm.  Pulmonary:     Effort: Pulmonary effort is normal.     Breath sounds: Normal breath sounds.   Abdominal:     General: Abdomen is flat. Bowel sounds are normal. There is no distension.     Palpations: Abdomen is soft.     Tenderness: There is no abdominal tenderness.  Neurological:     General: No focal deficit present.     Mental Status: He is alert and oriented to person, place, and time.  Psychiatric:        Mood and Affect: Mood normal.        Behavior: Behavior normal.        Thought Content: Thought content normal.        Judgment: Judgment normal.        ASSESSMENT & PLAN    Problem List Items Addressed This Visit       Endocrine   Type 2 diabetes mellitus without complication, without long-term current use of insulin (HCC)   Relevant Orders   Ambulatory referral to Ophthalmology     Nervous and Auditory   Peripheral polyneuropathy    - increased gabapentin to 300mg  at bedtime        Relevant Medications   gabapentin (NEURONTIN) 300 MG capsule     Musculoskeletal and Integument   Cervical vertebral fusion    - referral sent to preferred  doctor.       Relevant Orders   AMB referral to orthopedics     Other   Weak urinary stream    - pt currently on flomax 0.4mg  and doesn't notice a difference-has been on it for years. Says that when he ejaculates on flomax nothing comes out but when he doesn't take the flomax he can ejaculate  - says when he doesn't take the flomax he also notices prostate pain  - will refer to urology for further assessment       Relevant Orders   Ambulatory referral to Urology   Other Visit Diagnoses     Routine adult health maintenance    -  Primary   Relevant Orders   Lipid panel   PSA   CBC   CMP14+EGFR   Encounter for hepatitis C screening test for low risk patient       Relevant Orders   Hepatitis C antibody   Screening for prostate cancer       Relevant Orders   PSA   Vitamin D deficiency       Relevant Orders   Vitamin D (25 hydroxy)       No follow-ups on file.      Meds ordered this encounter   Medications   gabapentin (NEURONTIN) 300 MG capsule    Sig: Take 1 capsule (300 mg total) by mouth at bedtime.    Dispense:  90 capsule    Refill:  1   tamsulosin (FLOMAX) 0.4 MG CAPS capsule    Sig: Take 1 capsule (0.4 mg total) by mouth daily.    Dispense:  90 capsule    Refill:  1    Orders Placed This Encounter  Procedures   Lipid panel    Order Specific Question:   Has the patient fasted?    Answer:   No    Order Specific Question:   Release to patient    Answer:   Immediate   PSA   CBC   CMP14+EGFR    Order Specific Question:   Has the patient fasted?    Answer:   No   Vitamin D (25 hydroxy)   Hepatitis C antibody   AMB referral to orthopedics    Referral Priority:   Routine    Referral Type:   Consultation    Number of Visits Requested:   1   Ambulatory referral to Urology    Referral Priority:   Routine    Referral Type:   Consultation    Referral Reason:   Specialty Services Required    Requested Specialty:   Urology    Number of Visits Requested:   1   Ambulatory referral to Ophthalmology    Referral Priority:   Routine    Referral Type:   Consultation    Referral Reason:   Specialty Services Required    Requested Specialty:   Ophthalmology    Number of Visits Requested:   1     Charlton Amor, DO  [facility] Medicine at [facility] [phone] (phone) [phone] (fax)  [facility]

## 2023-01-14 NOTE — Assessment & Plan Note (Signed)
-   referral sent to preferred doctor.

## 2023-01-15 ENCOUNTER — Other Ambulatory Visit: Payer: Self-pay | Admitting: Family Medicine

## 2023-01-15 NOTE — Progress Notes (Signed)
Pt sent message leaving practice. Removed myself off pcp name.  Active prescriptions of gabapentin, flomax, and wellbutrin that had 90 day supplies were switched to 30 day supply in compliance with [place] rule of providing care for 30 days until patient can find a new doctor.  Called and notified [facility] pharmacy via voicemail about change from 90 to 30 day supply

## 2023-01-18 NOTE — Telephone Encounter (Signed)
Left a voicemail to the patient on 01/17/23, to give me a call.

## 2023-01-22 ENCOUNTER — Ambulatory Visit: Payer: Medicare Other | Admitting: Sports Medicine

## 2023-01-25 ENCOUNTER — Other Ambulatory Visit: Payer: Self-pay | Admitting: Family Medicine

## 2023-02-06 ENCOUNTER — Encounter: Payer: Self-pay | Admitting: Family Medicine

## 2023-02-07 ENCOUNTER — Other Ambulatory Visit: Payer: Self-pay | Admitting: Family Medicine

## 2023-02-07 DIAGNOSIS — M5126 Other intervertebral disc displacement, lumbar region: Secondary | ICD-10-CM

## 2023-02-07 DIAGNOSIS — M4322 Fusion of spine, cervical region: Secondary | ICD-10-CM

## 2023-02-21 DIAGNOSIS — M5416 Radiculopathy, lumbar region: Secondary | ICD-10-CM | POA: Diagnosis not present

## 2023-02-21 DIAGNOSIS — Z6826 Body mass index (BMI) 26.0-26.9, adult: Secondary | ICD-10-CM | POA: Diagnosis not present

## 2023-02-21 DIAGNOSIS — M5412 Radiculopathy, cervical region: Secondary | ICD-10-CM | POA: Diagnosis not present

## 2023-02-25 ENCOUNTER — Other Ambulatory Visit: Payer: Self-pay | Admitting: Neurological Surgery

## 2023-02-25 DIAGNOSIS — M5416 Radiculopathy, lumbar region: Secondary | ICD-10-CM

## 2023-02-25 DIAGNOSIS — M5412 Radiculopathy, cervical region: Secondary | ICD-10-CM

## 2023-02-26 ENCOUNTER — Ambulatory Visit: Payer: Medicare Other | Admitting: Family Medicine

## 2023-02-26 DIAGNOSIS — Z0000 Encounter for general adult medical examination without abnormal findings: Secondary | ICD-10-CM

## 2023-02-26 DIAGNOSIS — Z122 Encounter for screening for malignant neoplasm of respiratory organs: Secondary | ICD-10-CM

## 2023-02-26 DIAGNOSIS — Z87891 Personal history of nicotine dependence: Secondary | ICD-10-CM

## 2023-02-26 NOTE — Progress Notes (Signed)
MEDICARE ANNUAL WELLNESS VISIT  02/26/2023  Telephone Visit Disclaimer This Medicare AWV was conducted by telephone due to national recommendations for restrictions regarding the COVID-19 Pandemic (e.g. social distancing).  I verified, using two identifiers, that I am speaking with Marcus Brock or their authorized healthcare agent. I discussed the limitations, risks, security, and privacy concerns of performing an evaluation and management service by telephone and the potential availability of an in-person appointment in the future. The patient expressed understanding and agreed to proceed.  Location of Patient: Home Location of Provider (nurse):  In the office.  Subjective:    Marcus Brock is a 67 y.o. male patient of No primary care provider on file. who had a [employer] Visit today via telephone. Marcus Brock is Retired and lives with their son. he has 2 children. he reports that he is socially active and does interact with friends/family regularly. he is moderately physically active and enjoys watching sports.  No care team member to display     02/26/2023    2:33 PM  Advanced Directives  Does Patient Have a Medical Advance Directive? Yes  Type of Advance Directive Living will  Does patient want to make changes to medical advance directive? No - Patient declined    Hospital Utilization Over the Past 12 Months: # of hospitalizations or ER visits: 0 # of surgeries: 1  Review of Systems    Patient reports that his overall health is worse compared to last year.  History obtained from chart review and the patient  Patient Reported Readings (BP, Pulse, CBG, Weight, etc) none Per patient no change in vitals since last visit, unable to obtain new vitals due to telehealth visit  Pain Assessment Pain : 0-10 Pain Score: 2  Pain Type: Chronic pain Pain Location: Neck Pain Descriptors / Indicators: Constant Pain Onset: More than a month ago Pain Frequency:  Constant     Current Medications & Allergies (verified) Allergies as of 02/26/2023       Reactions   Penicillins Other (See Comments)   Doesn't remember   Wasp Venom Hives        Medication List        Accurate as of February 26, 2023  2:48 PM. If you have any questions, ask your nurse or doctor.          acetaminophen 650 MG CR tablet Commonly known as: TYLENOL Take by mouth.   aspirin EC 81 MG tablet Take by mouth.   atorvastatin 40 MG tablet Commonly known as: LIPITOR Take 1 tablet by mouth at bedtime.   Azelastine HCl 137 MCG/SPRAY Soln ONE SPRAY BY BOTH NOSTRILS ROUTE 2 (TWO) TIMES DAILY. USE IN EACH NOSTRIL AS DIRECTED   Breztri Aerosphere 160-9-4.8 MCG/ACT Aero Generic drug: Budeson-Glycopyrrol-Formoterol Inhale 2 puffs into the lungs 2 (two) times daily.   buPROPion 150 MG 24 hr tablet Commonly known as: WELLBUTRIN XL TAKE 1 TABLET BY MOUTH EVERY DAY   Docusate Sodium 100 MG capsule Take by mouth.   DULoxetine 60 MG capsule Commonly known as: CYMBALTA Take 60 mg by mouth daily.   EPINEPHrine 0.3 mg/0.3 mL Soaj injection Commonly known as: EPI-PEN Inject into the muscle.   famotidine 20 MG tablet Commonly known as: PEPCID Take 20 mg by mouth 2 (two) times daily.   gabapentin 300 MG capsule Commonly known as: NEURONTIN Take 1 capsule (300 mg total) by mouth at bedtime.   meloxicam 15 MG tablet Commonly known as: MOBIC Take 15 mg  by mouth daily.   metFORMIN 500 MG tablet Commonly known as: GLUCOPHAGE Take by mouth.   tamsulosin 0.4 MG Caps capsule Commonly known as: FLOMAX Take 1 capsule (0.4 mg total) by mouth daily.   tiZANidine 4 MG tablet Commonly known as: ZANAFLEX Take by mouth.   Ventolin HFA 108 (90 Base) MCG/ACT inhaler Generic drug: albuterol Inhale into the lungs.   albuterol 108 (90 Base) MCG/ACT inhaler Commonly known as: VENTOLIN HFA TAKE 2 PUFFS BY MOUTH EVERY 6 HOURS AS NEEDED FOR WHEEZE OR SHORTNESS OF  BREATH   VITAMIN D PO Take by mouth.   Vitamin D3 50 MCG (2000 UT) capsule Take by mouth.        History (reviewed): Past Medical History:  Diagnosis Date   Allergy 1980   Anxiety 1982   Arthritis 1995   Asthma    COPD (chronic obstructive pulmonary disease) (HCC) 2024   Depression 1982   Diabetes mellitus without complication (HCC)    GERD (gastroesophageal reflux disease) 1992   Neuromuscular disorder (HCC) 2018   Ulcer 1988   Past Surgical History:  Procedure Laterality Date   APPENDECTOMY  01/2017   CERVICAL FUSION  01/2016   CHOLECYSTECTOMY  1995   COLON RESECTION N/A 04/2003   COLON SURGERY  2004   Colon resection   COLONOSCOPY  07/2002   12/2009; 12/2014   GANGLION CYST EXCISION Left 01/2002   left elbow   HERNIA REPAIR  2024   Double   KNEE ARTHROSCOPY WITH MEDIAL MENISECTOMY Left 07/2017   NECK SURGERY  01/2014   SHOULDER SURGERY Right 12/2001   SPINE SURGERY  2018   Neck Fusion   TUBAL LIGATION  1986   Vasectomy   Family History  Problem Relation Age of Onset   Hypertension Other    Diabetes Other    Stroke Other    Colon cancer Other    Skin cancer Other    Prostate cancer Other    COPD Mother    Depression Mother    Diabetes Mother    Heart disease Mother    Hypertension Mother    Cancer Father    COPD Father    Depression Father    Diabetes Father    Heart disease Father    Hypertension Father    Stroke Father    Cancer Maternal Grandfather    Early death Maternal Grandmother    Heart disease Maternal Grandmother    Early death Paternal Grandmother    Heart disease Paternal Grandmother    ADD / ADHD Son    Anxiety disorder Son    Birth defects Son    Depression Son    Learning disabilities Son    Early death Maternal Uncle    Heart disease Maternal Uncle    Early death Paternal Aunt    Social History   Socioeconomic History   Marital status: Widowed    Spouse name: Not on file   Number of children: 2   Years of  education: 21   Highest education level: Some college, no degree  Occupational History   Occupation: Retired.  Tobacco Use   Smoking status: Former    Current packs/day: 0.00    Average packs/day: 1.6 packs/day for 43.8 years (70.0 ttl pk-yrs)    Types: Cigarettes    Quit date: 04/23/2011    Years since quitting: 11.8   Smokeless tobacco: Never  Vaping Use   Vaping status: Never Used  Substance and Sexual Activity  Alcohol use: Never   Drug use: Never   Sexual activity: Yes    Partners: Female    Birth control/protection: None  Other Topics Concern   Not on file  Social History Narrative   Lives with his eldest son. He has two sons. He enjoys watching sports.   Social Determinants of Health   Financial Resource Strain: Medium Risk (02/25/2023)   Overall Financial Resource Strain (CARDIA)    Difficulty of Paying Living Expenses: Somewhat hard  Food Insecurity: No Food Insecurity (02/25/2023)   Hunger Vital Sign    Worried About Running Out of Food in the Last Year: Never true    Ran Out of Food in the Last Year: Never true  Transportation Needs: No Transportation Needs (02/25/2023)   PRAPARE - Administrator, Civil Service (Medical): No    Lack of Transportation (Non-Medical): No  Physical Activity: Inactive (02/25/2023)   Exercise Vital Sign    Days of Exercise per Week: 0 days    Minutes of Exercise per Session: 0 min  Stress: Stress Concern Present (02/25/2023)   [employer] of Occupational Health - Occupational Stress Questionnaire    Feeling of Stress : Rather much  Social Connections: Socially Isolated (02/26/2023)   Social Connection and Isolation Panel [NHANES]    Frequency of Communication with Friends and Family: Never    Frequency of Social Gatherings with Friends and Family: Never    Attends Religious Services: Never    Database administrator or Organizations: No    Attends Banker Meetings: Never    Marital Status: Widowed     Activities of Daily Living    02/25/2023    8:00 PM  In your present state of health, do you have any difficulty performing the following activities:  Hearing? 0  Vision? 0  Difficulty concentrating or making decisions? 0  Walking or climbing stairs? 0  Dressing or bathing? 0  Doing errands, shopping? 0  Preparing Food and eating ? N  Using the Toilet? N  In the past six months, have you accidently leaked urine? Y  Do you have problems with loss of bowel control? N  Managing your Medications? N  Managing your Finances? N  Housekeeping or managing your Housekeeping? N    Patient Education/ Literacy How often do you need to have someone help you when you read instructions, pamphlets, or other written materials from your doctor or pharmacy?: 1 - Never What is the last grade level you completed in school?: 12th grade  Exercise    Diet Patient reports consuming 2 meals a day and 2 snack(s) a day Patient reports that his primary diet is: Regular Patient reports that she does have regular access to food.   Depression Screen    02/26/2023    2:26 PM 12/05/2022    4:30 PM  PHQ 2/9 Scores  PHQ - 2 Score 2 6  PHQ- 9 Score 7 16     Fall Risk    02/26/2023    2:26 PM 02/25/2023    8:00 PM 12/05/2022    4:29 PM  Fall Risk   Falls in the past year? 1 1 0  Number falls in past yr: 0 0 0  Injury with Fall? 0 0 0  Risk for fall due to : History of fall(s)  No Fall Risks  Follow up Education provided;Falls evaluation completed;Falls prevention discussed  Falls evaluation completed     Objective:  Kemauri Musa seemed alert  and oriented and he participated appropriately during our telephone visit.  Blood Pressure Weight BMI  BP Readings from Last 3 Encounters:  01/14/23 (!) 152/85  01/02/23 134/80  12/05/22 (!) 149/99   Wt Readings from Last 3 Encounters:  01/14/23 194 lb 8 oz (88.2 kg)  01/02/23 193 lb (87.5 kg)  12/05/22 193 lb 12 oz (87.9 kg)   BMI Readings from  Last 1 Encounters:  01/14/23 27.13 kg/m    *Unable to obtain current vital signs, weight, and BMI due to telephone visit type  Hearing/Vision  Anival did not seem to have difficulty with hearing/understanding during the telephone conversation Reports that he has not had a formal eye exam by an eye care professional within the past year Reports that he has not had a formal hearing evaluation within the past year *Unable to fully assess hearing and vision during telephone visit type  Cognitive Function:    02/26/2023    2:34 PM  6CIT Screen  What Year? 0 points  What month? 0 points  What time? 0 points  Count back from 20 0 points  Months in reverse 0 points  Repeat phrase 0 points  Total Score 0 points   (Normal:0-7, Significant for Dysfunction: >8)  Normal Cognitive Function Screening: Yes   Immunization & Health Maintenance Record Immunization History  Administered Date(s) Administered   Fluad Quad(high Dose 65+) 03/02/2022   Fluad Trivalent(High Dose 65+) 01/02/2023   Influenza, Quadrivalent, Recombinant, Inj, Pf 04/02/2018   MMR 11/11/1998   Moderna Sars-Covid-2 Vaccination 11/27/2019, 12/25/2019   PNEUMOCOCCAL CONJUGATE-20 11/29/2021   Pneumococcal Polysaccharide-23 06/25/2014   Respiratory Syncytial Virus Vaccine,Recomb Aduvanted(Arexvy) 03/02/2022   Tdap 06/25/2014, 11/10/2021   Zoster Recombinant(Shingrix) 11/21/2016, 06/02/2017, 06/10/2017    Health Maintenance  Topic Date Due   OPHTHALMOLOGY EXAM  Never done   Diabetic kidney evaluation - Urine ACR  02/27/2023 (Originally 06/12/1973)   FOOT EXAM  02/27/2023 (Originally 06/12/1965)   COVID-19 Vaccine (3 - 2023-24 season) 03/14/2023 (Originally 12/23/2022)   Diabetic kidney evaluation - eGFR measurement  05/29/2023 (Originally 06/12/1973)   Lung Cancer Screening  02/26/2024 (Originally 06/12/2006)   Colonoscopy  02/26/2024 (Originally 07/11/2023)   Hepatitis C Screening  02/26/2024 (Originally 06/12/1973)    HEMOGLOBIN A1C  06/07/2023   Medicare Annual Wellness (AWV)  02/26/2024   DTaP/Tdap/Td (3 - Td or Tdap) 11/11/2031   Pneumonia Vaccine 66+ Years old  Completed   INFLUENZA VACCINE  Completed   Zoster Vaccines- Shingrix  Completed   HPV VACCINES  Aged Out       Assessment  This is a routine wellness examination for [employer].  Health Maintenance: Due or Overdue Health Maintenance Due  Topic Date Due   OPHTHALMOLOGY EXAM  Never done    Marcus Brock does not need a referral for [employer] Assistance: Care Management:   no Social Work:    no Prescription Assistance:  no Nutrition/Diabetes Education:  no   Plan:  Personalized Goals  Goals Addressed               This Visit's Progress     Patient Stated (pt-stated)        Patient stated he would like to get his pain under control to be able to enjoy life again.       Personalized Health Maintenance & Screening Recommendations  Foot exam Kidney evaluation Urine ACR Colonoscopy - per patient, due next year.  Lung Cancer Screening Recommended: yes; last one completed in 11/24/21 at [facility]. (Low Dose  CT Chest recommended if Age 50-80 years, 20 pack-year currently smoking OR have quit w/in past 15 years) Hepatitis C Screening recommended: no HIV Screening recommended: no  Advanced Directives: Written information was not prepared per patient's request.  Referrals & Orders Orders Placed This Encounter  Procedures   Ambulatory Referral for Lung Cancer Scre    Follow-up Plan Follow-up with Dr. Tamera Punt as planned Medicare wellness visit in one year.  Patient will access AVS on my chart.   I have personally reviewed and noted the following in the patient's chart:   Medical and social history Use of alcohol, tobacco or illicit drugs  Current medications and supplements Functional ability and status Nutritional status Physical activity Advanced directives List of other physicians Hospitalizations,  surgeries, and ER visits in previous 12 months Vitals Screenings to include cognitive, depression, and falls Referrals and appointments  In addition, I have reviewed and discussed with Marcus Brock certain preventive protocols, quality metrics, and best practice recommendations. A written personalized care plan for preventive services as well as general preventive health recommendations is available and can be mailed to the patient at his request.      Modesto Charon, RN BSN  02/26/2023

## 2023-02-26 NOTE — Patient Instructions (Addendum)
MEDICARE ANNUAL WELLNESS VISIT Health Maintenance Summary and Written Plan of Care  [name] ,  Thank you for allowing me to perform your Medicare Annual Wellness Visit and for your ongoing commitment to your health.   Health Maintenance & Immunization History Health Maintenance  Topic Date Due   OPHTHALMOLOGY EXAM  Never done   Diabetic kidney evaluation - Urine ACR  02/27/2023 (Originally [birth year]1974)   FOOT EXAM  02/27/2023 (Originally 06/12/1965)   COVID-19 Vaccine (3 - 2023-24 season) 03/14/2023 (Originally 12/23/2022)   Diabetic kidney evaluation - eGFR measurement  05/29/2023 (Originally [birth year]1974)   Lung Cancer Screening  02/26/2024 (Originally 06/12/2006)   Colonoscopy  02/26/2024 (Originally 07/11/2023)   Hepatitis C Screening  02/26/2024 (Originally 06/12/1973)   HEMOGLOBIN A1C  06/07/2023   Medicare Annual Wellness (AWV)  02/26/2024   DTaP/Tdap/Td (3 - Td or Tdap) 11/11/2031   Pneumonia Vaccine 93+ Years old  Completed   INFLUENZA VACCINE  Completed   Zoster Vaccines- Shingrix  Completed   HPV VACCINES  Aged Out   Immunization History  Administered Date(s) Administered   Fluad Quad(high Dose 65+) 03/02/2022   Fluad Trivalent(High Dose 65+) 01/02/2023   Influenza, Quadrivalent, Recombinant, Inj, Pf 04/02/2018   MMR 11/11/1998   Moderna Sars-Covid-2 Vaccination 11/27/2019, 12/25/2019   PNEUMOCOCCAL CONJUGATE-20 11/29/2021   Pneumococcal Polysaccharide-23 06/25/2014   Respiratory Syncytial Virus Vaccine,Recomb Aduvanted(Arexvy) 03/02/2022   Tdap 06/25/2014, 11/10/2021   Zoster Recombinant(Shingrix) 11/21/2016, 06/02/2017, 06/10/2017    These are the patient goals that we discussed:  Goals Addressed               This Visit's Progress     Patient Stated (pt-stated)        Patient stated he would like to get his pain under control to be able to enjoy life again.         This is a list of Health Maintenance Items that are overdue or due now: Health  Maintenance Due  Topic Date Due   OPHTHALMOLOGY EXAM  Never done   Personalized Health Maintenance & Screening Recommendations  Foot exam Kidney evaluation Urine ACR Colonoscopy - per patient, due next year.   Orders/Referrals Placed Today: Orders Placed This Encounter  Procedures   Ambulatory Referral for Lung Cancer Scre    Referral Priority:   Routine    Referral Type:   Consultation    Referral Reason:   Specialty Services Required    Number of Visits Requested:   1   (Contact our referral department at [phone] if you have not spoken with someone about your referral appointment within the next 5 days)    Follow-up Plan Follow-up with Dr. Tamera Punt as planned Medicare wellness visit in one year.  Patient will access AVS on my chart.      Health Maintenance, Male Adopting a healthy lifestyle and getting preventive care are important in promoting health and wellness. Ask your health care provider about: The right schedule for you to have regular tests and exams. Things you can do on your own to prevent diseases and keep yourself healthy. What should I know about diet, weight, and exercise? Eat a healthy diet  Eat a diet that includes plenty of vegetables, fruits, low-fat dairy products, and lean protein. Do not eat a lot of foods that are high in solid fats, added sugars, or sodium. Maintain a healthy weight Body mass index (BMI) is a measurement that can be used to identify possible weight problems. It estimates body fat based on  height and weight. Your health care provider can help determine your BMI and help you achieve or maintain a healthy weight. Get regular exercise Get regular exercise. This is one of the most important things you can do for your health. Most adults should: Exercise for at least 150 minutes each week. The exercise should increase your heart rate and make you sweat (moderate-intensity exercise). Do strengthening exercises at least twice a week.  This is in addition to the moderate-intensity exercise. Spend less time sitting. Even light physical activity can be beneficial. Watch cholesterol and blood lipids Have your blood tested for lipids and cholesterol at 66 years of age, then have this test every 5 years. You may need to have your cholesterol levels checked more often if: Your lipid or cholesterol levels are high. You are older than 66 years of age. You are at high risk for heart disease. What should I know about cancer screening? Many types of cancers can be detected early and may often be prevented. Depending on your health history and family history, you may need to have cancer screening at various ages. This may include screening for: Colorectal cancer. Prostate cancer. Skin cancer. Lung cancer. What should I know about heart disease, diabetes, and high blood pressure? Blood pressure and heart disease High blood pressure causes heart disease and increases the risk of stroke. This is more likely to develop in people who have high blood pressure readings or are overweight. Talk with your health care provider about your target blood pressure readings. Have your blood pressure checked: Every 3-5 years if you are 49-65 years of age. Every year if you are 22 years old or older. If you are between the ages of 46 and 34 and are a current or former smoker, ask your health care provider if you should have a one-time screening for abdominal aortic aneurysm (AAA). Diabetes Have regular diabetes screenings. This checks your fasting blood sugar level. Have the screening done: Once every three years after age 35 if you are at a normal weight and have a low risk for diabetes. More often and at a younger age if you are overweight or have a high risk for diabetes. What should I know about preventing infection? Hepatitis B If you have a higher risk for hepatitis B, you should be screened for this virus. Talk with your health care provider  to find out if you are at risk for hepatitis B infection. Hepatitis C Blood testing is recommended for: Everyone born from [birth year] through 1964. Anyone with known risk factors for hepatitis C. Sexually transmitted infections (STIs) You should be screened each year for STIs, including gonorrhea and chlamydia, if: You are sexually active and are younger than 66 years of age. You are older than 66 years of age and your health care provider tells you that you are at risk for this type of infection. Your sexual activity has changed since you were last screened, and you are at increased risk for chlamydia or gonorrhea. Ask your health care provider if you are at risk. Ask your health care provider about whether you are at high risk for HIV. Your health care provider may recommend a prescription medicine to help prevent HIV infection. If you choose to take medicine to prevent HIV, you should first get tested for HIV. You should then be tested every 3 months for as long as you are taking the medicine. Follow these instructions at home: Alcohol use Do not drink alcohol if your health  care provider tells you not to drink. If you drink alcohol: Limit how much you have to 0-2 drinks a day. Know how much alcohol is in your drink. In the [place] one drink equals one 12 oz bottle of beer (355 mL), one 5 oz glass of wine (148 mL), or one 1 oz glass of hard liquor (44 mL). Lifestyle Do not use any products that contain nicotine or tobacco. These products include cigarettes, chewing tobacco, and vaping devices, such as e-cigarettes. If you need help quitting, ask your health care provider. Do not use street drugs. Do not share needles. Ask your health care provider for help if you need support or information about quitting drugs. General instructions Schedule regular health, dental, and eye exams. Stay current with your vaccines. Tell your health care provider if: You often feel depressed. You have ever been  abused or do not feel safe at home. Summary Adopting a healthy lifestyle and getting preventive care are important in promoting health and wellness. Follow your health care provider's instructions about healthy diet, exercising, and getting tested or screened for diseases. Follow your health care provider's instructions on monitoring your cholesterol and blood pressure. This information is not intended to replace advice given to you by your health care provider. Make sure you discuss any questions you have with your health care provider. Document Revised: 08/29/2020 Document Reviewed: 08/29/2020 Elsevier Patient Education  2024 [employer].

## 2023-03-05 ENCOUNTER — Encounter: Payer: Self-pay | Admitting: Family Medicine

## 2023-03-11 ENCOUNTER — Other Ambulatory Visit: Payer: Self-pay | Admitting: Neurological Surgery

## 2023-03-11 ENCOUNTER — Ambulatory Visit: Payer: Medicare Other

## 2023-03-11 DIAGNOSIS — M5023 Other cervical disc displacement, cervicothoracic region: Secondary | ICD-10-CM | POA: Diagnosis not present

## 2023-03-11 DIAGNOSIS — M5021 Other cervical disc displacement,  high cervical region: Secondary | ICD-10-CM | POA: Diagnosis not present

## 2023-03-11 DIAGNOSIS — Z1159 Encounter for screening for other viral diseases: Secondary | ICD-10-CM | POA: Diagnosis not present

## 2023-03-11 DIAGNOSIS — M47816 Spondylosis without myelopathy or radiculopathy, lumbar region: Secondary | ICD-10-CM | POA: Diagnosis not present

## 2023-03-11 DIAGNOSIS — M47812 Spondylosis without myelopathy or radiculopathy, cervical region: Secondary | ICD-10-CM

## 2023-03-11 DIAGNOSIS — M5416 Radiculopathy, lumbar region: Secondary | ICD-10-CM

## 2023-03-11 DIAGNOSIS — M47817 Spondylosis without myelopathy or radiculopathy, lumbosacral region: Secondary | ICD-10-CM | POA: Diagnosis not present

## 2023-03-11 DIAGNOSIS — M48061 Spinal stenosis, lumbar region without neurogenic claudication: Secondary | ICD-10-CM | POA: Diagnosis not present

## 2023-03-11 DIAGNOSIS — M4807 Spinal stenosis, lumbosacral region: Secondary | ICD-10-CM | POA: Diagnosis not present

## 2023-03-11 DIAGNOSIS — Z125 Encounter for screening for malignant neoplasm of prostate: Secondary | ICD-10-CM | POA: Diagnosis not present

## 2023-03-11 DIAGNOSIS — M5127 Other intervertebral disc displacement, lumbosacral region: Secondary | ICD-10-CM | POA: Diagnosis not present

## 2023-03-11 DIAGNOSIS — Z0000 Encounter for general adult medical examination without abnormal findings: Secondary | ICD-10-CM | POA: Diagnosis not present

## 2023-03-11 DIAGNOSIS — E559 Vitamin D deficiency, unspecified: Secondary | ICD-10-CM | POA: Diagnosis not present

## 2023-03-11 DIAGNOSIS — M4802 Spinal stenosis, cervical region: Secondary | ICD-10-CM | POA: Diagnosis not present

## 2023-03-11 DIAGNOSIS — M5412 Radiculopathy, cervical region: Secondary | ICD-10-CM

## 2023-03-12 LAB — CMP14+EGFR
ALT: 36 [IU]/L (ref 0–44)
AST: 28 [IU]/L (ref 0–40)
Albumin: 4.8 g/dL (ref 3.9–4.9)
Alkaline Phosphatase: 73 [IU]/L (ref 44–121)
BUN/Creatinine Ratio: 21 (ref 10–24)
BUN: 21 mg/dL (ref 8–27)
Bilirubin Total: 0.6 mg/dL (ref 0.0–1.2)
CO2: 24 mmol/L (ref 20–29)
Calcium: 10.4 mg/dL — ABNORMAL HIGH (ref 8.6–10.2)
Chloride: 102 mmol/L (ref 96–106)
Creatinine, Ser: 0.98 mg/dL (ref 0.76–1.27)
Globulin, Total: 2.4 g/dL (ref 1.5–4.5)
Glucose: 118 mg/dL — ABNORMAL HIGH (ref 70–99)
Potassium: 5.1 mmol/L (ref 3.5–5.2)
Sodium: 141 mmol/L (ref 134–144)
Total Protein: 7.2 g/dL (ref 6.0–8.5)
eGFR: 85 mL/min/{1.73_m2} (ref >59)

## 2023-03-12 LAB — CBC
Hematocrit: 48.2 % (ref 37.5–51.0)
Hemoglobin: 15.9 g/dL (ref 13.0–17.7)
MCH: 31.4 pg (ref 26.6–33.0)
MCHC: 33 g/dL (ref 31.5–35.7)
MCV: 95 fL (ref 79–97)
Platelets: 245 10*3/uL (ref 150–450)
RBC: 5.07 x10E6/uL (ref 4.14–5.80)
RDW: 12.5 % (ref 11.6–15.4)
WBC: 12 10*3/uL — ABNORMAL HIGH (ref 3.4–10.8)

## 2023-03-12 LAB — LIPID PANEL
Chol/HDL Ratio: 3.2 {ratio} (ref 0.0–5.0)
Cholesterol, Total: 154 mg/dL (ref 100–199)
HDL: 48 mg/dL (ref >39)
LDL Chol Calc (NIH): 83 mg/dL (ref 0–99)
Triglycerides: 127 mg/dL (ref 0–149)
VLDL Cholesterol Cal: 23 mg/dL (ref 5–40)

## 2023-03-12 LAB — VITAMIN D 25 HYDROXY (VIT D DEFICIENCY, FRACTURES): Vit D, 25-Hydroxy: 30 ng/mL (ref 30.0–100.0)

## 2023-03-12 LAB — HEPATITIS C ANTIBODY: Hep C Virus Ab: NONREACTIVE

## 2023-03-12 LAB — PSA: Prostate Specific Ag, Serum: 1.4 ng/mL (ref 0.0–4.0)

## 2023-04-03 ENCOUNTER — Encounter: Payer: Self-pay | Admitting: Family Medicine

## 2023-04-03 ENCOUNTER — Ambulatory Visit (INDEPENDENT_AMBULATORY_CARE_PROVIDER_SITE_OTHER): Payer: Medicare Other | Admitting: Family Medicine

## 2023-04-03 ENCOUNTER — Encounter: Payer: Self-pay | Admitting: Sports Medicine

## 2023-04-03 VITALS — BP 148/91 | HR 86 | Ht 71.0 in | Wt 189.2 lb

## 2023-04-03 DIAGNOSIS — F321 Major depressive disorder, single episode, moderate: Secondary | ICD-10-CM

## 2023-04-03 DIAGNOSIS — M2550 Pain in unspecified joint: Secondary | ICD-10-CM | POA: Diagnosis not present

## 2023-04-03 DIAGNOSIS — E119 Type 2 diabetes mellitus without complications: Secondary | ICD-10-CM

## 2023-04-03 DIAGNOSIS — E782 Mixed hyperlipidemia: Secondary | ICD-10-CM

## 2023-04-03 DIAGNOSIS — M4322 Fusion of spine, cervical region: Secondary | ICD-10-CM | POA: Diagnosis not present

## 2023-04-03 DIAGNOSIS — M5412 Radiculopathy, cervical region: Secondary | ICD-10-CM

## 2023-04-03 MED ORDER — METFORMIN HCL 500 MG PO TABS: 500.0000 mg | ORAL_TABLET | Freq: Every day | ORAL | 2 refills | Status: DC

## 2023-04-03 MED ORDER — ATORVASTATIN CALCIUM 40 MG PO TABS: 40.0000 mg | ORAL_TABLET | Freq: Every day | ORAL | 2 refills | Status: DC

## 2023-04-03 MED ORDER — BREZTRI AEROSPHERE 160-9-4.8 MCG/ACT IN AERO: 2.0000 | INHALATION_SPRAY | Freq: Two times a day (BID) | RESPIRATORY_TRACT | 11 refills | Status: DC

## 2023-04-03 MED ORDER — DULOXETINE HCL 60 MG PO CPEP: 60.0000 mg | ORAL_CAPSULE | Freq: Every day | ORAL | 2 refills | Status: DC

## 2023-04-03 MED ORDER — BUPROPION HCL ER (XL) 150 MG PO TB24: 150.0000 mg | ORAL_TABLET | Freq: Every day | ORAL | 1 refills | Status: DC

## 2023-04-03 MED ORDER — GABAPENTIN 300 MG PO CAPS: 600.0000 mg | ORAL_CAPSULE | Freq: Three times a day (TID) | ORAL | 1 refills | Status: DC

## 2023-04-03 NOTE — Assessment & Plan Note (Signed)
Pt notes polyarthralgia and I am curious if there is a component of arthritis. Will go ahead and get labs today

## 2023-04-03 NOTE — Assessment & Plan Note (Addendum)
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 He does see Dr. Yetta Barre and had CT scan done but has not heard back yet, I asked him to reach out to their office to discuss next steps. Likely needs epidural injection. He said he has had them in the past and they lasted 6 months. He does want an apt with Dr. Karie Schwalbe to discuss next steps because this office is closer for him.  - I have increased his gabapentin to see if we can get his radiculopathy better controlled however we did discuss that the resolution of symptoms would be doing something about his back to which he understood.

## 2023-04-03 NOTE — Progress Notes (Signed)
Established patient visit   Patient: Marcus Brock   DOB: October 18, 1955   67 y.o. Male  MRN: [id number] Visit Date: 04/03/2023  Today's healthcare provider: Charlton Amor, DO   Chief Complaint  Patient presents with   Numbness    Weakness and swollen hand    SUBJECTIVE    Chief Complaint  Patient presents with   Numbness    Weakness and swollen hand   HPI HPI     Numbness    Additional comments: Weakness and swollen hand      Last edited by Roselyn Reef, CMA on 04/03/2023  2:08 PM.      Pt presents with weakness and swelling of hands. Has a long history of cervical pain and was referred to neurosurgery at last visit. From chart review it looks like he has a CT cervical spine was done but I do not see results.   Does have a hx of peripheral polyneuropathy for which he is on gabapentin 300mg . Does note increased pain   Review of Systems  Constitutional:  Negative for activity change, fatigue and fever.  Respiratory:  Negative for cough and shortness of breath.   Cardiovascular:  Negative for chest pain.  Gastrointestinal:  Negative for abdominal pain.  Genitourinary:  Negative for difficulty urinating.       Current Meds  Medication Sig   acetaminophen (TYLENOL) 650 MG CR tablet Take by mouth.   albuterol (VENTOLIN HFA) 108 (90 Base) MCG/ACT inhaler TAKE 2 PUFFS BY MOUTH EVERY 6 HOURS AS NEEDED FOR WHEEZE OR SHORTNESS OF BREATH   aspirin EC 81 MG tablet Take by mouth.   Azelastine HCl 137 MCG/SPRAY SOLN ONE SPRAY BY BOTH NOSTRILS ROUTE 2 (TWO) TIMES DAILY. USE IN EACH NOSTRIL AS DIRECTED   Cholecalciferol (VITAMIN D3) 50 MCG (2000 UT) capsule Take by mouth.   Docusate Sodium 100 MG capsule Take by mouth.   EPINEPHrine 0.3 mg/0.3 mL IJ SOAJ injection Inject into the muscle.   famotidine (PEPCID) 20 MG tablet Take 20 mg by mouth 2 (two) times daily.   meloxicam (MOBIC) 15 MG tablet Take 15 mg by mouth daily.   tamsulosin (FLOMAX) 0.4 MG CAPS capsule Take 1  capsule (0.4 mg total) by mouth daily.   tiZANidine (ZANAFLEX) 4 MG tablet Take by mouth.   VITAMIN D PO Take by mouth.   [DISCONTINUED] atorvastatin (LIPITOR) 40 MG tablet Take 1 tablet by mouth at bedtime.   [DISCONTINUED] Budeson-Glycopyrrol-Formoterol (BREZTRI AEROSPHERE) 160-9-4.8 MCG/ACT AERO Inhale 2 puffs into the lungs 2 (two) times daily.   [DISCONTINUED] buPROPion (WELLBUTRIN XL) 150 MG 24 hr tablet TAKE 1 TABLET BY MOUTH EVERY DAY   [DISCONTINUED] DULoxetine (CYMBALTA) 60 MG capsule Take 60 mg by mouth daily.   [DISCONTINUED] gabapentin (NEURONTIN) 300 MG capsule Take 1 capsule (300 mg total) by mouth at bedtime.   [DISCONTINUED] metFORMIN (GLUCOPHAGE) 500 MG tablet Take by mouth.    OBJECTIVE    BP (!) 148/91 (BP Location: Left Arm, Patient Position: Sitting, Cuff Size: Large)   Pulse 86   Ht 5\' 11"  (1.803 m)   Wt 189 lb 4 oz (85.8 kg)   SpO2 98%   BMI 26.40 kg/m   Physical Exam Vitals and nursing note reviewed.  Constitutional:      General: He is not in acute distress.    Appearance: Normal appearance.  HENT:     Head: Normocephalic and atraumatic.     Right Ear: External ear normal.  Left Ear: External ear normal.     Nose: Nose normal.  Eyes:     Conjunctiva/sclera: Conjunctivae normal.  Cardiovascular:     Rate and Rhythm: Normal rate.  Pulmonary:     Effort: Pulmonary effort is normal.  Musculoskeletal:     Comments: Cervical radiculopathy  Neurological:     General: No focal deficit present.     Mental Status: He is alert and oriented to person, place, and time.  Psychiatric:        Mood and Affect: Mood normal.        Behavior: Behavior normal.        Thought Content: Thought content normal.        Judgment: Judgment normal.        ASSESSMENT & PLAN    Problem List Items Addressed This Visit       Endocrine   Type 2 diabetes mellitus without complication, without long-term current use of insulin (HCC)    Will get A1c today, likely will  need 3 month follow up but would like to see what A1c is because if elevated we may need to add glp 1       Relevant Medications   atorvastatin (LIPITOR) 40 MG tablet   metFORMIN (GLUCOPHAGE) 500 MG tablet   Other Relevant Orders   HgB A1c   AMB Referral VBCI Care Management     Nervous and Auditory   Cervical radiculopathy    Pt has hx of cervical vertebral fusion with known disc bulging likely the reason for his b/l arm weakness. He does see Dr. Yetta Barre and had CT scan done but has not heard back yet, I asked him to reach out to their office to discuss next steps. Likely needs epidural injection. He said he has had them in the past and they lasted 6 months. He does want an apt with Dr. Karie Schwalbe to discuss next steps because this office is closer for him.  - I have increased his gabapentin to see if we can get his radiculopathy better controlled however we did discuss that the resolution of symptoms would be doing something about his back to which he understood.      Relevant Medications   buPROPion (WELLBUTRIN XL) 150 MG 24 hr tablet   DULoxetine (CYMBALTA) 60 MG capsule   gabapentin (NEURONTIN) 300 MG capsule     Musculoskeletal and Integument   Cervical vertebral fusion - Primary     Other   Mixed hyperlipidemia    Pt needs refill on his atorvastatin, tolerating well      Relevant Medications   atorvastatin (LIPITOR) 40 MG tablet   Other Relevant Orders   AMB Referral VBCI Care Management   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 episode (HCC)    Needs refills, have sent      Relevant Medications   buPROPion (WELLBUTRIN XL) 150 MG 24 hr tablet   DULoxetine (CYMBALTA) 60 MG capsule   Polyarthralgia    Pt notes polyarthralgia and I am curious if there is a component of arthritis. Will go ahead and get labs today      Relevant Orders   Sed Rate (ESR)   C-reactive protein   ANA,IFA RA Diag Pnl w/rflx Tit/Patn    No follow-ups on file.      Meds  ordered this encounter  Medications   atorvastatin (LIPITOR) 40 MG tablet    Sig: Take 1 tablet (40 mg total) by mouth at bedtime.  Dispense:  90 tablet    Refill:  2   Budeson-Glycopyrrol-Formoterol (BREZTRI AEROSPHERE) 160-9-4.8 MCG/ACT AERO    Sig: Inhale 2 puffs into the lungs 2 (two) times daily.    Dispense:  1 g    Refill:  11   buPROPion (WELLBUTRIN XL) 150 MG 24 hr tablet    Sig: Take 1 tablet (150 mg total) by mouth daily.    Dispense:  90 tablet    Refill:  1   DULoxetine (CYMBALTA) 60 MG capsule    Sig: Take 1 capsule (60 mg total) by mouth daily.    Dispense:  90 capsule    Refill:  2   gabapentin (NEURONTIN) 300 MG capsule    Sig: Take 2 capsules (600 mg total) by mouth 3 (three) times daily.    Dispense:  90 capsule    Refill:  1   metFORMIN (GLUCOPHAGE) 500 MG tablet    Sig: Take 1 tablet (500 mg total) by mouth daily with breakfast.    Dispense:  90 tablet    Refill:  2    Orders Placed This Encounter  Procedures   Sed Rate (ESR)   C-reactive protein   ANA,IFA RA Diag Pnl w/rflx Tit/Patn   HgB A1c   AMB Referral VBCI Care Management    Referral Priority:   Routine    Referral Type:   Consultation    Referral Reason:   Care Coordination    Number of Visits Requested:   1     Charlton Amor, DO  [facility] Primary [facility] & Sports Medicine at [facility] [phone] (phone) [phone] (fax)  [facility]

## 2023-04-03 NOTE — Assessment & Plan Note (Signed)
Pt needs refill on his atorvastatin, tolerating well

## 2023-04-03 NOTE — Assessment & Plan Note (Signed)
Needs refills, have sent

## 2023-04-03 NOTE — Assessment & Plan Note (Signed)
Will get A1c today, likely will need 3 month follow up but would like to see what A1c is because if elevated we may need to add glp 1

## 2023-04-04 ENCOUNTER — Ambulatory Visit (INDEPENDENT_AMBULATORY_CARE_PROVIDER_SITE_OTHER): Payer: Medicare Other | Admitting: Sports Medicine

## 2023-04-04 ENCOUNTER — Encounter: Payer: Self-pay | Admitting: Sports Medicine

## 2023-04-04 ENCOUNTER — Telehealth: Payer: Self-pay

## 2023-04-04 DIAGNOSIS — M47816 Spondylosis without myelopathy or radiculopathy, lumbar region: Secondary | ICD-10-CM

## 2023-04-04 DIAGNOSIS — Q761 Klippel-Feil syndrome: Secondary | ICD-10-CM

## 2023-04-04 DIAGNOSIS — M5412 Radiculopathy, cervical region: Secondary | ICD-10-CM

## 2023-04-04 NOTE — Assessment & Plan Note (Signed)
Pleasant 67 year old male, long history of axial low back pain, he has had multiple interventions including medications and physical therapy. Has never had lumbar surgery. He was seeing Dr. Yetta Barre and would like another opinion from me. Historically he has been on meloxicam, gabapentin twice a day, just increased to 300 mg 3 times daily, he has not quite reached the 600 mg 3 times daily recommended by his PCP. He is also taking Cymbalta and occasional tizanidine. He finds that the above regimen controls his pain to some degree. We had a long discussion today regarding lumbar spine degenerative disc disease, arthritis as well as the natural history and treatment approach. Realistic expectations were given. I am in agreement that he should bump the gabapentin up to 300 mg 3 times daily and do this for a month before considering 600 mg 3 times daily. Continue other medications. He is not so certain that the meloxicam is giving him much benefit so plans to discontinue it in about a month and if worsening of pain we can certainly restart. Return to see me in approximately 4 weeks and we can consider gabapentin increased versus epidural if not better.

## 2023-04-04 NOTE — Progress Notes (Signed)
    Procedures performed today:    None.  Independent interpretation of notes and tests performed by another provider:   None.  Brief History, Exam, Impression, and Recommendations:    Lumbar spondylosis Pleasant 67 year old male, long history of axial low back pain, he has had multiple interventions including medications and physical therapy. Has never had lumbar surgery. He was seeing Dr. Yetta Barre and would like another opinion from me. Historically he has been on meloxicam, gabapentin twice a day, just increased to 300 mg 3 times daily, he has not quite reached the 600 mg 3 times daily recommended by his PCP. He is also taking Cymbalta and occasional tizanidine. He finds that the above regimen controls his pain to some degree. We had a long discussion today regarding lumbar spine degenerative disc disease, arthritis as well as the natural history and treatment approach. Realistic expectations were given. I am in agreement that he should bump the gabapentin up to 300 mg 3 times daily and do this for a month before considering 600 mg 3 times daily. Continue other medications. He is not so certain that the meloxicam is giving him much benefit so plans to discontinue it in about a month and if worsening of pain we can certainly restart. Return to see me in approximately 4 weeks and we can consider gabapentin increased versus epidural if not better.  Cervical fusion syndrome Peyton Najjar also has a history of a C5-C7 fusion, persistent axial neck pain with bilateral radicular symptoms. He does have a CT cervical spine that does show adjacent level disease. As below we discussed the natural history, I set realistic expectations. He will continue gabapentin increased to 300 mg 3 times daily for now. Continue meloxicam, tizanidine, Cymbalta. He will do some home PT for his neck and low back. If insufficient improvement of 4-week follow-up we will consider bumping up to his PCPs initial  recommendation of 600 mg 3 times daily. Cervical epidurals can also be reserved if all else fails. He does not desire any additional surgery and does not desire any sort of narcotic treatment.  I spent 40 minutes of total time managing this patient today, this includes chart review, face to face, and non-face to face time.  ____________________________________________ Ihor Austin. Benjamin Stain, M.D., ABFM., CAQSM., AME. Primary Care and Sports Medicine [facility]  Adjunct Professor of Family Medicine  [employer]  Restaurant manager, fast food

## 2023-04-04 NOTE — Assessment & Plan Note (Signed)
Marcus Brock also has a history of a C5-C7 fusion, persistent axial neck pain with bilateral radicular symptoms. He does have a CT cervical spine that does show adjacent level disease. As below we discussed the natural history, I set realistic expectations. He will continue gabapentin increased to 300 mg 3 times daily for now. Continue meloxicam, tizanidine, Cymbalta. He will do some home PT for his neck and low back. If insufficient improvement of 4-week follow-up we will consider bumping up to his PCPs initial recommendation of 600 mg 3 times daily. Cervical epidurals can also be reserved if all else fails. He does not desire any additional surgery and does not desire any sort of narcotic treatment.

## 2023-04-04 NOTE — Progress Notes (Signed)
   Care Guide Note  04/04/2023 Name: Marcus Brock MRN: [id number] DOB: 06/22/1955  Referred by: Charlton Amor, DO Reason for referral : Care Coordination (Outreach to schedule with Pharm d )   Marcus Brock is a 67 y.o. year old male who is a primary care patient of Charlton Amor, DO. Marcus Brock was referred to the pharmacist for assistance related to DM.    An unsuccessful telephone outreach was attempted today to contact the patient who was referred to the pharmacy team for assistance with medication assistance. Additional attempts will be made to contact the patient.   [name] , RMA     [name]  [facility], [facility] Guide  Direct Dial: [phone]  Website: [email or web address]

## 2023-04-05 ENCOUNTER — Encounter: Payer: Self-pay | Admitting: Family Medicine

## 2023-04-05 LAB — SEDIMENTATION RATE: Sed Rate: 2 mm/h (ref 0–30)

## 2023-04-05 LAB — C-REACTIVE PROTEIN: CRP: 1 mg/L (ref 0–10)

## 2023-04-05 LAB — ANA,IFA RA DIAG PNL W/RFLX TIT/PATN
ANA Titer 1: NEGATIVE
Cyclic Citrullin Peptide Ab: 8 U (ref 0–19)
Rheumatoid fact SerPl-aCnc: <10 [IU]/mL (ref <14.0)

## 2023-04-05 LAB — HEMOGLOBIN A1C
Est. average glucose Bld gHb Est-mCnc: 134 mg/dL
Hgb A1c MFr Bld: 6.3 % — ABNORMAL HIGH (ref 4.8–5.6)

## 2023-04-08 ENCOUNTER — Encounter: Payer: Self-pay | Admitting: Sports Medicine

## 2023-04-11 ENCOUNTER — Encounter: Payer: Self-pay | Admitting: Family Medicine

## 2023-04-11 NOTE — Progress Notes (Signed)
Care Guide Pharmacy Note  04/11/2023 Name: Marcus Brock MRN: [id number] DOB: 10-Dec-1955  Referred By: Charlton Amor, DO Reason for referral: Care Coordination (Outreach to schedule with Pharm d )   Marcus Brock is a 67 y.o. year old male who is a primary care patient of Charlton Amor, DO.  Marcus Brock was referred to the pharmacist for assistance related to: DMII  A second unsuccessful telephone outreach was attempted today to contact the patient who was referred to the pharmacy team for assistance with medication assistance. Additional attempts will be made to contact the patient.  [name] , RMA     [facility]  [facility], [facility] Guide  Direct Dial: [phone]  Website: [email or web address]

## 2023-04-13 ENCOUNTER — Other Ambulatory Visit: Payer: Self-pay | Admitting: Family Medicine

## 2023-04-16 NOTE — Progress Notes (Signed)
Care Guide Pharmacy Note  04/16/2023 Name: Marcus Brock MRN: [id number] DOB: 03-Apr-1956  Referred By: Charlton Amor, DO Reason for referral: Care Coordination (Outreach to schedule with Pharm d )   Marcus Brock is a 67 y.o. year old male who is a primary care patient of Charlton Amor, DO.  Marcus Brock was referred to the pharmacist for assistance related to: DMII  A third unsuccessful telephone outreach was attempted today to contact the patient who was referred to the pharmacy team for assistance with medication assistance. The Population Health team is pleased to engage with this patient at any time in the future upon receipt of referral and should he/she be interested in assistance from the [facility] team.  [name] , RMA     [facility]  [facility], [facility] Guide  Direct Dial: [phone]  Website: [email or web address]

## 2023-04-26 ENCOUNTER — Other Ambulatory Visit: Payer: Self-pay

## 2023-04-26 NOTE — Progress Notes (Signed)
   04/26/2023  Patient ID: Marcus Brock, male   DOB: 1955/08/06, 68 y.o.   MRN: [id number]  S/O Telephone visit to assist with affordability of medications in response to referral from patient's PCP, Dr. Bevin  Medication Access/Adherence -Reviewed complete medication list with patient -He endorses good adherence to all medications except for Breztri , which he is using sparingly to make last longer due to high cost of medication -Patient states some days he is having to use albuterol  rescue inhaler 2-3 times daily due to SOB- likely to improve if Breztri  used as prescribed -Dr. Bevin also mentioned in most recent OV note potentially adding GLP1 based on result of A1c-this would also likely be costly for the patient  A/P  Medication Access/Adherence -Based on [facility], patient would qualify for AZ&Me PAP for Breztri ; will coordinate with the medication assistance team to start the application process -Patient's A1c remains 6.3%, so I do not think addition of GLP1 is necessary at this time.  If needed in the future, patient would also qualify for Novo PAP for Ozempic, though.  Follow-up:  2 weeks to check on PAP status  Channing DELENA Mealing, PharmD, DPLA

## 2023-05-02 ENCOUNTER — Ambulatory Visit (INDEPENDENT_AMBULATORY_CARE_PROVIDER_SITE_OTHER): Payer: Medicare Other

## 2023-05-02 ENCOUNTER — Encounter: Payer: Self-pay | Admitting: Sports Medicine

## 2023-05-02 ENCOUNTER — Ambulatory Visit (INDEPENDENT_AMBULATORY_CARE_PROVIDER_SITE_OTHER): Payer: Medicare Other | Admitting: Sports Medicine

## 2023-05-02 DIAGNOSIS — M1812 Unilateral primary osteoarthritis of first carpometacarpal joint, left hand: Secondary | ICD-10-CM | POA: Diagnosis not present

## 2023-05-02 DIAGNOSIS — Q761 Klippel-Feil syndrome: Secondary | ICD-10-CM | POA: Diagnosis not present

## 2023-05-02 DIAGNOSIS — M47816 Spondylosis without myelopathy or radiculopathy, lumbar region: Secondary | ICD-10-CM | POA: Diagnosis not present

## 2023-05-02 DIAGNOSIS — M19042 Primary osteoarthritis, left hand: Secondary | ICD-10-CM | POA: Diagnosis not present

## 2023-05-02 DIAGNOSIS — M79641 Pain in right hand: Secondary | ICD-10-CM | POA: Diagnosis not present

## 2023-05-02 DIAGNOSIS — M170 Bilateral primary osteoarthritis of knee: Secondary | ICD-10-CM

## 2023-05-02 DIAGNOSIS — M19041 Primary osteoarthritis, right hand: Secondary | ICD-10-CM | POA: Insufficient documentation

## 2023-05-02 DIAGNOSIS — M1811 Unilateral primary osteoarthritis of first carpometacarpal joint, right hand: Secondary | ICD-10-CM | POA: Diagnosis not present

## 2023-05-02 DIAGNOSIS — S62611A Displaced fracture of proximal phalanx of left index finger, initial encounter for closed fracture: Secondary | ICD-10-CM | POA: Diagnosis not present

## 2023-05-02 DIAGNOSIS — M25561 Pain in right knee: Secondary | ICD-10-CM | POA: Diagnosis not present

## 2023-05-02 DIAGNOSIS — M25562 Pain in left knee: Secondary | ICD-10-CM | POA: Diagnosis not present

## 2023-05-02 DIAGNOSIS — M5412 Radiculopathy, cervical region: Secondary | ICD-10-CM | POA: Diagnosis not present

## 2023-05-02 DIAGNOSIS — M1711 Unilateral primary osteoarthritis, right knee: Secondary | ICD-10-CM | POA: Diagnosis not present

## 2023-05-02 DIAGNOSIS — M1712 Unilateral primary osteoarthritis, left knee: Secondary | ICD-10-CM | POA: Diagnosis not present

## 2023-05-02 MED ORDER — PREDNISONE 50 MG PO TABS: ORAL_TABLET | ORAL | 0 refills | Status: DC

## 2023-05-02 MED ORDER — GABAPENTIN 600 MG PO TABS: ORAL_TABLET | ORAL | 3 refills | Status: DC

## 2023-05-02 NOTE — Assessment & Plan Note (Signed)
 Bilateral hand pain and swelling, likely osteoarthritis flare, prednisone as above should help, I would like some x-rays.

## 2023-05-02 NOTE — Assessment & Plan Note (Addendum)
 History of C5-C7 fusion, persistent axial neck pain and bilateral radicular symptoms not better with gabapentin  600 3 times daily, meloxicam , tizanidine , Cymbalta . He is hurting all over and I think he is having a flare in chronic pain so we will add 5 days of steroids and increase gabapentin  to 1200 mg 3 times daily. Adding formal PT. He does endorse Lhermitte sign so we will get a cervical spine MRI.  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 For this reason we will proceed with urgent neurosurgery/orthospine referral as I do think he will need urgent decompression.

## 2023-05-02 NOTE — Progress Notes (Addendum)
    Procedures performed today:    None.  Independent interpretation of notes and tests performed by another provider:   None.  Brief History, Exam, Impression, and Recommendations:    Cervical fusion syndrome History of C5-C7 fusion, persistent axial neck pain and bilateral radicular symptoms not better with gabapentin  600 3 times daily, meloxicam , tizanidine , Cymbalta . He is hurting all over and I think he is having a flare in chronic pain so we will add 5 days of steroids and increase gabapentin  to 1200 mg 3 times daily. Adding formal PT. He does endorse Lhermitte sign so we will get a cervical spine MRI.  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 For this reason we will proceed with urgent neurosurgery/orthospine referral as I do think he will need urgent decompression.  Lumbar spondylosis As before he would like to transfer care here from Dr. Joshua, we will bump his gabapentin  up to 1200 mg 3 times daily. Never had lumbar surgery. We will likely proceed with epidural if he does not get improvement after physical therapy and the increase in gabapentin .  Primary osteoarthritis of both hands Bilateral hand pain and swelling, likely osteoarthritis flare, prednisone  as above should help, I would like some x-rays.  Primary osteoarthritis of both knees Increasing knee pain, buckling, he does need therapy, steroids as above should help. We can discuss individual joints at the follow-up.    ____________________________________________ Debby PARAS. Curtis, M.D., ABFM., CAQSM., AME. Primary Care and Sports Medicine [facility]  Adjunct Professor of [facility] Medicine  [employer]  Restaurant Manager, [employer]

## 2023-05-02 NOTE — Assessment & Plan Note (Signed)
 As before he would like to transfer care here from Dr. Joshua, we will bump his gabapentin  up to 1200 mg 3 times daily. Never had lumbar surgery. We will likely proceed with epidural if he does not get improvement after physical therapy and the increase in gabapentin .

## 2023-05-02 NOTE — Assessment & Plan Note (Signed)
 Increasing knee pain, buckling, he does need therapy, steroids as above should help. We can discuss individual joints at the follow-up.

## 2023-05-10 ENCOUNTER — Other Ambulatory Visit: Payer: Self-pay

## 2023-05-10 NOTE — Progress Notes (Signed)
   05/10/2023  Patient ID: Marcus Brock, male   DOB: 1955-08-31, 68 y.o.   MRN: [id number]  Coordinating with medication assistance team to initiate patient assistance program application for [facility] inhaler.  Lenna Gilford, PharmD, DPLA

## 2023-05-13 ENCOUNTER — Encounter (INDEPENDENT_AMBULATORY_CARE_PROVIDER_SITE_OTHER): Payer: Self-pay | Admitting: Sports Medicine

## 2023-05-13 ENCOUNTER — Encounter: Payer: Self-pay | Admitting: Family Medicine

## 2023-05-13 ENCOUNTER — Ambulatory Visit: Payer: Self-pay | Admitting: Family Medicine

## 2023-05-13 ENCOUNTER — Ambulatory Visit: Payer: Medicare Other

## 2023-05-13 ENCOUNTER — Ambulatory Visit (INDEPENDENT_AMBULATORY_CARE_PROVIDER_SITE_OTHER): Payer: Medicare Other | Admitting: Family Medicine

## 2023-05-13 ENCOUNTER — Ambulatory Visit: Payer: Medicare Other | Admitting: Physical Therapy

## 2023-05-13 VITALS — BP 137/81 | HR 95 | Ht 71.0 in

## 2023-05-13 DIAGNOSIS — M79642 Pain in left hand: Secondary | ICD-10-CM

## 2023-05-13 DIAGNOSIS — M47816 Spondylosis without myelopathy or radiculopathy, lumbar region: Secondary | ICD-10-CM | POA: Diagnosis not present

## 2023-05-13 DIAGNOSIS — R918 Other nonspecific abnormal finding of lung field: Secondary | ICD-10-CM | POA: Diagnosis not present

## 2023-05-13 DIAGNOSIS — J441 Chronic obstructive pulmonary disease with (acute) exacerbation: Secondary | ICD-10-CM

## 2023-05-13 DIAGNOSIS — R062 Wheezing: Secondary | ICD-10-CM | POA: Diagnosis not present

## 2023-05-13 DIAGNOSIS — R051 Acute cough: Secondary | ICD-10-CM | POA: Diagnosis not present

## 2023-05-13 DIAGNOSIS — J449 Chronic obstructive pulmonary disease, unspecified: Secondary | ICD-10-CM | POA: Diagnosis not present

## 2023-05-13 DIAGNOSIS — R059 Cough, unspecified: Secondary | ICD-10-CM | POA: Diagnosis not present

## 2023-05-13 MED ORDER — IPRATROPIUM-ALBUTEROL 0.5-2.5 (3) MG/3ML IN SOLN: 3.0000 mL | Freq: Four times a day (QID) | RESPIRATORY_TRACT | 3 refills | Status: AC | PRN

## 2023-05-13 MED ORDER — DOXYCYCLINE HYCLATE 100 MG PO TABS: 100.0000 mg | ORAL_TABLET | Freq: Two times a day (BID) | ORAL | 0 refills | Status: AC

## 2023-05-13 MED ORDER — HYDROCOD POLI-CHLORPHE POLI ER 10-8 MG/5ML PO SUER: 5.0000 mL | Freq: Two times a day (BID) | ORAL | 0 refills | Status: DC | PRN

## 2023-05-13 MED ORDER — METHYLPREDNISOLONE SODIUM SUCC 125 MG IJ SOLR
125.0000 mg | Freq: Once | INTRAMUSCULAR | Status: AC
  Administered 2023-05-13: 125 mg via INTRAMUSCULAR

## 2023-05-13 MED ORDER — PROMETHAZINE-DM 6.25-15 MG/5ML PO SYRP: 5.0000 mL | ORAL_SOLUTION | Freq: Four times a day (QID) | ORAL | 0 refills | Status: DC | PRN

## 2023-05-13 MED ORDER — PREDNISONE 20 MG PO TABS: 20.0000 mg | ORAL_TABLET | Freq: Two times a day (BID) | ORAL | 0 refills | Status: AC

## 2023-05-13 NOTE — Assessment & Plan Note (Signed)
Given hx of copd in addition to symptoms of increased cough, sputum production and shortness of breath. Will go ahead and give doxycycline in addition to duoneb and steroids - solumedrol 125 IM given in clinic today as wheezing was heard on physical exam -xr chest ordered to look for signs of pneumonia - recommended pt increase hydration and rest, if unable to drink more fluids today told him he needed to go to ED and pt verbalized understanding

## 2023-05-13 NOTE — Telephone Encounter (Signed)
Copied from CRM [id number]. Topic: Appointments - Red Word Triage >> May 13, 2023  8:09 AM Geroge Baseman wrote: Severe cough, trouble breathing   Chief Complaint: Cough/Nasal congestion Symptoms: Cough, nasal congestion Frequency: A couple of days Pertinent Negatives: Patient denies severe difficulty breathing Disposition: [] ED /[] Urgent Care (no appt availability in office) / [x] Appointment(In office/virtual)/ []  [facility]/ [] Home Care/ [] Refused Recommended Disposition /[] [facility]/ []  Follow-up with PCP Additional Notes: Patient's neighbor called and advised that last night her husband went over to this patient's house (they are neighbors) and the patient has had a bad cough for a few days and very congested.  He didn't want to go to the ER last night and he wanted to see if he could get an appointment with his PCP this morning. She states that he doesn't appear to be having severe difficulty breathing and he just wanted to make an appointment this morning with his PCP if possible and if not, they would go to urgent care.  The caller states that her husband is best friends with the patient and he would be bringing him to this appointment made for this morning 05/13/2023 with his PCP at 11:30am.  She states that she doesn't know a lot of details about the symptoms that he has but she does know that he used his nebulizer last night.  She also said he sounded like he had rhonchi when he coughed but she didn't know if he was having a productive cough.  She is advised that if anything gets worse to go ahead and get the patient to the emergency room.  She verbalized understanding.  Reason for Disposition  [1] Continuous (nonstop) coughing interferes with work or school AND [2] no improvement using cough treatment per Care Advice  Answer Assessment - Initial Assessment Questions 1. ONSET: "When did the cough begin?"      A few days ago 2. SEVERITY: "How bad is the cough today?"       Unknown exactly---sounds like rhonchi per neighbor 3. SPUTUM: "Describe the color of your sputum" (none, dry cough; clear, white, yellow, green)     Unknown 4. HEMOPTYSIS: "Are you coughing up any blood?" If so ask: "How much?" (flecks, streaks, tablespoons, etc.)     unknown 5. DIFFICULTY BREATHING: "Are you having difficulty breathing?" If Yes, ask: "How bad is it?" (e.g., mild, moderate, severe)    - MILD: No SOB at rest, mild SOB with walking, speaks normally in sentences, can lie down, no retractions, pulse < 100.    - MODERATE: SOB at rest, SOB with minimal exertion and prefers to sit, cannot lie down flat, speaks in phrases, mild retractions, audible wheezing, pulse 100-120.    - SEVERE: Very SOB at rest, speaks in single words, struggling to breathe, sitting hunched forward, retractions, pulse > 120      unknown 6. FEVER: "Do you have a fever?" If Yes, ask: "What is your temperature, how was it measured, and when did it start?"     Unknown 7. CARDIAC HISTORY: "Do you have any history of heart disease?" (e.g., heart attack, congestive heart failure)      Unknown 8. LUNG HISTORY: "Do you have any history of lung disease?"  (e.g., pulmonary embolus, asthma, emphysema)     COPD 9. PE RISK FACTORS: "Do you have a history of blood clots?" (or: recent major surgery, recent prolonged travel, bedridden)     unknown 10. OTHER SYMPTOMS: "Do you have any other  symptoms?" (e.g., runny nose, wheezing, chest pain)       Nasal congestion 12. TRAVEL: "Have you traveled out of the country in the last month?" (e.g., travel history, exposures)       Unknown  Protocols used: Cough - Acute Productive-A-AH

## 2023-05-13 NOTE — Assessment & Plan Note (Signed)
Pt said he got message that promethazine dm wasn't covered under insurance, have sent in tussionex. Pmp reviewed and verified with no red flags. Pt stopped taking gabapentin after falls  - if tussionex is not covered recommend he pick up promethazine dm with good rx as it is <$20

## 2023-05-13 NOTE — Progress Notes (Signed)
Acute Office Visit  Subjective:     Patient ID: Marcus Brock, male    DOB: 1956/02/09, 68 y.o.   MRN: [id number]  Chief Complaint  Patient presents with   Nasal Congestion    Cough, nasal congestion x3 days    HPI Patient is in today for cough, congestion, and fatigue for a few days.  He said he was taking care of his sick son and started feeling sick yesterday.    He also notes a fall yesterday after taking gabapentin and says he has been "more wobbly" since the increase in medication for pain.   Review of Systems  Constitutional:  Negative for chills and fever.  Respiratory:  Positive for cough, shortness of breath and wheezing.   Cardiovascular:  Negative for chest pain.  Neurological:  Negative for headaches.        Objective:    BP 137/81 (BP Location: Left Arm, Patient Position: Sitting, Cuff Size: Large)   Pulse 95   Ht 5\' 11"  (1.803 m)   SpO2 96%   BMI 26.40 kg/m    Physical Exam Vitals and nursing note reviewed.  Constitutional:      General: He is not in acute distress.    Appearance: Normal appearance.  HENT:     Head: Normocephalic and atraumatic.     Right Ear: External ear normal.     Left Ear: External ear normal.     Nose: Nose normal.  Eyes:     Conjunctiva/sclera: Conjunctivae normal.  Cardiovascular:     Rate and Rhythm: Normal rate and regular rhythm.  Pulmonary:     Effort: Pulmonary effort is normal.     Breath sounds: Wheezing present.  Neurological:     General: No focal deficit present.     Mental Status: He is alert and oriented to person, place, and time.  Psychiatric:        Mood and Affect: Mood normal.        Behavior: Behavior normal.        Thought Content: Thought content normal.        Judgment: Judgment normal.     No results found for any visits on 05/13/23.      Assessment & Plan:   Problem List Items Addressed This Visit       Respiratory   COPD exacerbation (HCC) - Primary   Given hx of copd in  addition to symptoms of increased cough, sputum production and shortness of breath. Will go ahead and give doxycycline in addition to duoneb and steroids - solumedrol 125 IM given in clinic today as wheezing was heard on physical exam -xr chest ordered to look for signs of pneumonia - recommended pt increase hydration and rest, if unable to drink more fluids today told him he needed to go to ED and pt verbalized understanding      Relevant Medications   doxycycline (VIBRA-TABS) 100 MG tablet   ipratropium-albuterol (DUONEB) 0.5-2.5 (3) MG/3ML SOLN   predniSONE (DELTASONE) 20 MG tablet   chlorpheniramine-HYDROcodone (TUSSIONEX) 10-8 MG/5ML   methylPREDNISolone sodium succinate (SOLU-MEDROL) 125 mg/2 mL injection 125 mg   Other Relevant Orders   DG Chest 2 View (Completed)     Other   Left hand pain   Pt had a fall secondary to increased gabapentin use and side effects. He fell yesterday and hit his left hand on the hand rail. Says his left hand was more swollen yesterday. Today on exam there is swelling and ecchymosis.  Will obtain xray for concern for fracture  - recommended he reach out to dr t about gabapentin and alternatives       Relevant Orders   DG Hand Complete Left (Completed)   Acute cough   Pt said he got message that promethazine dm wasn't covered under insurance, have sent in tussionex. Pmp reviewed and verified with no red flags. Pt stopped taking gabapentin after falls  - if tussionex is not covered recommend he pick up promethazine dm with good rx as it is <$20      Relevant Medications   predniSONE (DELTASONE) 20 MG tablet   methylPREDNISolone sodium succinate (SOLU-MEDROL) 125 mg/2 mL injection 125 mg    Meds ordered this encounter  Medications   doxycycline (VIBRA-TABS) 100 MG tablet    Sig: Take 1 tablet (100 mg total) by mouth 2 (two) times daily for 7 days.    Dispense:  14 tablet    Refill:  0   ipratropium-albuterol (DUONEB) 0.5-2.5 (3) MG/3ML SOLN     Sig: Take 3 mLs by nebulization every 6 (six) hours as needed.    Dispense:  120 mL    Refill:  3    Please dispense #120 vials   DISCONTD: promethazine-dextromethorphan (PROMETHAZINE-DM) 6.25-15 MG/5ML syrup    Sig: Take 5 mLs by mouth 4 (four) times daily as needed for cough (Maximum dose: 30mL in 24 hours).    Dispense:  118 mL    Refill:  0   predniSONE (DELTASONE) 20 MG tablet    Sig: Take 1 tablet (20 mg total) by mouth 2 (two) times daily with a meal for 5 days.    Dispense:  10 tablet    Refill:  0   chlorpheniramine-HYDROcodone (TUSSIONEX) 10-8 MG/5ML    Sig: Take 5 mLs by mouth every 12 (twelve) hours as needed for cough (cough, will cause drowsiness.).    Dispense:  120 mL    Refill:  0   methylPREDNISolone sodium succinate (SOLU-MEDROL) 125 mg/2 mL injection 125 mg    Return if symptoms worsen or fail to improve.  Charlton Amor, DO

## 2023-05-13 NOTE — Assessment & Plan Note (Addendum)
Pt had a fall secondary to increased gabapentin use and side effects. He fell yesterday and hit his left hand on the hand rail. Says his left hand was more swollen yesterday. Today on exam there is swelling and ecchymosis. Will obtain xray for concern for fracture  - recommended he reach out to dr t about gabapentin and alternatives

## 2023-05-14 ENCOUNTER — Other Ambulatory Visit: Payer: Medicare Other

## 2023-05-14 ENCOUNTER — Telehealth: Payer: Self-pay

## 2023-05-14 NOTE — Progress Notes (Signed)
Pharmacy Medication Assistance Program Note    05/14/2023  Patient ID: Marcus Brock, male   DOB: Sep 19, 1955, 68 y.o.   MRN: [id number]     05/14/2023  Outreach Medication One  Initial Outreach Date (Medication One) 05/14/2023  Manufacturer Medication One [employer]  [employer] [facility] Bretztri  Dose of Breztri 160-9-4.8 MCG/ACT  Type of Assistance Manufacturer Assistance  Date Application Sent to Patient 05/14/2023  Application Items Requested Application;Proof of Income  Date Application Sent to Prescriber 05/14/2023  Name of Prescriber ERIKA WACHS DO.     Signature

## 2023-05-14 NOTE — Telephone Encounter (Signed)
PAP: PAP application for Breztri, [employer] ([employer]) has been mailed to pt's home address on file.   FAXED provider portion of application to provider's office OF ERIKA WACHS DO FOR REVIEW.

## 2023-05-14 NOTE — Telephone Encounter (Signed)

## 2023-05-21 ENCOUNTER — Encounter: Payer: Self-pay | Admitting: Sports Medicine

## 2023-05-21 NOTE — Telephone Encounter (Signed)
RECEIVED PROVIDER PAGE AND SCANNED IN TO MEDIA OF CHART WAITING ON PT PAGE WAS MAILED TO PT 05/14/2023   PLEASE BE ADVISED

## 2023-05-27 ENCOUNTER — Ambulatory Visit: Payer: Self-pay | Admitting: Family Medicine

## 2023-05-27 NOTE — Telephone Encounter (Signed)
Patient called back and stated that his arms, hands, and feet are numb from neuropathy. He is in a lot of pain. Patient states his provider told him that the elevated blood pressure could be high due to the pain. Patient states he has Losartan 25 mg that he has been taking. His pulse is also elevated at around 110 when in pain. Blood pressure was been in the 150s/110s recently.

## 2023-05-27 NOTE — Telephone Encounter (Signed)
Chief Complaint: Elevated blood pressure  Symptoms: Elevated blood pressure, mild weakness in arms and legs Frequency: constant x 3 days  Pertinent Negatives: Patient denies slurred speech, chest pain, blurred vision, headache  Disposition: [] ED /[] Urgent Care (no appt availability in office) / [x] Appointment(In office/virtual)/ []  [facility]/ [] Home Care/ [] Refused Recommended Disposition /[] [facility]/ []  Follow-up with PCP Additional Notes: Patient's neighbor called to schedule an appointment for patient. The neighbor stated patient reported having some elevated blood pressure readings with his home monitor the last few days.  Neighbors unsure what the blood pressure readings are and if he takes blood pressure medication. None is listed on current medication list. Attempted to contact patient to get more details, no answer and voicemail was left. Care advice was given to the neighbor and the patient was scheduled for an OV with PCP tomorrow.  Copied from CRM [id number]. Topic: Clinical - Red Word Triage >> May 27, 2023  1:07 PM Alvino Blood C wrote: Red Word that prompted transfer to Nurse Triage: Patient's blood pressure has been high. He is experiencing the following symptoms: loss of strength in his arms and legs Reason for Disposition  Systolic BP  >= 180 OR Diastolic >= 110  Answer Assessment - Initial Assessment Questions 1. BLOOD PRESSURE: "What is the blood pressure?" "Did you take at least two measurements 5 minutes apart?"     Unsure  2. ONSET: "When did you take your blood pressure?"     Past few days  3. HOW: "How did you take your blood pressure?" (e.g., automatic home BP monitor, visiting nurse)     Automatic home BP monitor  4. HISTORY: "Do you have a history of high blood pressure?"     Yes  5. MEDICINES: "Are you taking any medicines for blood pressure?" "Have you missed any doses recently?"     He does  6. OTHER SYMPTOMS: "Do you have any symptoms?" (e.g.,  blurred vision, chest pain, difficulty breathing, headache, weakness)     Weakness in both arms and legs  Protocols used: Blood Pressure - High-A-AH

## 2023-05-27 NOTE — Telephone Encounter (Signed)
Reached out to the pt just to check on him and the symptoms he is having, he doesn't think he's having a stroke and thinks its all related to his neuropathy, advised if anything got worse or unbearable to pls go to the ER or urgent care. He voiced his understanding and states he feels fine now and will wait for his appt tomm. Roselyn Reef, CMA

## 2023-05-28 ENCOUNTER — Ambulatory Visit: Payer: Medicare Other

## 2023-05-28 ENCOUNTER — Encounter: Payer: Self-pay | Admitting: Family Medicine

## 2023-05-28 ENCOUNTER — Ambulatory Visit (INDEPENDENT_AMBULATORY_CARE_PROVIDER_SITE_OTHER): Payer: Medicare Other | Admitting: Family Medicine

## 2023-05-28 VITALS — BP 138/90 | HR 97 | Ht 71.0 in | Wt 183.0 lb

## 2023-05-28 DIAGNOSIS — Q761 Klippel-Feil syndrome: Secondary | ICD-10-CM

## 2023-05-28 DIAGNOSIS — M4802 Spinal stenosis, cervical region: Secondary | ICD-10-CM

## 2023-05-28 DIAGNOSIS — M4803 Spinal stenosis, cervicothoracic region: Secondary | ICD-10-CM | POA: Diagnosis not present

## 2023-05-28 DIAGNOSIS — F410 Panic disorder [episodic paroxysmal anxiety] without agoraphobia: Secondary | ICD-10-CM | POA: Diagnosis not present

## 2023-05-28 DIAGNOSIS — M5030 Other cervical disc degeneration, unspecified cervical region: Secondary | ICD-10-CM | POA: Diagnosis not present

## 2023-05-28 DIAGNOSIS — M5023 Other cervical disc displacement, cervicothoracic region: Secondary | ICD-10-CM | POA: Diagnosis not present

## 2023-05-28 MED ORDER — ALPRAZOLAM 0.5 MG PO TABS: 0.5000 mg | ORAL_TABLET | Freq: Two times a day (BID) | ORAL | 0 refills | Status: AC | PRN

## 2023-05-28 NOTE — Progress Notes (Signed)
 Established patient visit   Patient: Marcus Brock   DOB: 12/10/1955   68 y.o. Male  MRN: [id number] Visit Date: 05/28/2023  Today's healthcare provider: Bernice GORMAN Juneau, DO   Chief Complaint  Patient presents with   Hypertension    SUBJECTIVE    Chief Complaint  Patient presents with   Hypertension   HPI   Pt presents with elevated blood pressure today at 138/92. Does admit to chronic pain and has had some neuropathy in his hands, feet, and arms b/l that he is currently working with Dr. ONEIDA for. Reports home Bps in the 150s/110s. Does admit to periods of panic when this happens and it causes his heart rate to elevate even more.   He is going for his MRI today.  Review of Systems  Constitutional:  Negative for activity change, fatigue and fever.  Respiratory:  Negative for cough and shortness of breath.   Cardiovascular:  Negative for chest pain.  Gastrointestinal:  Negative for abdominal pain.  Genitourinary:  Negative for difficulty urinating.       Current Meds  Medication Sig   acetaminophen  (TYLENOL ) 650 MG CR tablet Take by mouth.   albuterol  (VENTOLIN  HFA) 108 (90 Base) MCG/ACT inhaler TAKE 2 PUFFS BY MOUTH EVERY 6 HOURS AS NEEDED FOR WHEEZE OR SHORTNESS OF BREATH   ALPRAZolam  (XANAX ) 0.5 MG tablet Take 1 tablet (0.5 mg total) by mouth 2 (two) times daily as needed for anxiety.   aspirin EC 81 MG tablet Take by mouth.   atorvastatin  (LIPITOR) 40 MG tablet Take 1 tablet (40 mg total) by mouth at bedtime.   Azelastine HCl 137 MCG/SPRAY SOLN    Budeson-Glycopyrrol-Formoterol (BREZTRI  AEROSPHERE) 160-9-4.8 MCG/ACT AERO Inhale 2 puffs into the lungs 2 (two) times daily.   buPROPion  (WELLBUTRIN  XL) 150 MG 24 hr tablet Take 1 tablet (150 mg total) by mouth daily.   chlorpheniramine-HYDROcodone  (TUSSIONEX) 10-8 MG/5ML Take 5 mLs by mouth every 12 (twelve) hours as needed for cough (cough, will cause drowsiness.).   Cholecalciferol (VITAMIN D3) 50 MCG (2000 UT) capsule  Take by mouth.   Docusate Sodium 100 MG capsule Take by mouth.   DULoxetine  (CYMBALTA ) 60 MG capsule Take 1 capsule (60 mg total) by mouth daily.   EPINEPHrine  0.3 mg/0.3 mL IJ SOAJ injection Inject into the muscle.   famotidine (PEPCID) 20 MG tablet Take 20 mg by mouth 2 (two) times daily.   gabapentin  (NEURONTIN ) 600 MG tablet Take 1 tablet (600 mg total) by mouth 3 (three) times daily.   ipratropium-albuterol  (DUONEB) 0.5-2.5 (3) MG/3ML SOLN Take 3 mLs by nebulization every 6 (six) hours as needed.   meloxicam  (MOBIC ) 15 MG tablet Take 15 mg by mouth daily.   metFORMIN  (GLUCOPHAGE ) 500 MG tablet Take 1 tablet (500 mg total) by mouth daily with breakfast.   tamsulosin  (FLOMAX ) 0.4 MG CAPS capsule Take 1 capsule (0.4 mg total) by mouth daily.   tiZANidine  (ZANAFLEX ) 4 MG tablet Take by mouth.    OBJECTIVE    BP (!) 138/90   Pulse 97   Ht 5' 11 (1.803 m)   Wt 183 lb (83 kg)   SpO2 98%   BMI 25.52 kg/m   Physical Exam Vitals and nursing note reviewed.  Constitutional:      General: He is not in acute distress.    Appearance: Normal appearance.  HENT:     Head: Normocephalic and atraumatic.     Right Ear: External ear normal.  Left Ear: External ear normal.     Nose: Nose normal.  Eyes:     Conjunctiva/sclera: Conjunctivae normal.  Cardiovascular:     Rate and Rhythm: Normal rate and regular rhythm.  Pulmonary:     Effort: Pulmonary effort is normal.     Breath sounds: Normal breath sounds.  Neurological:     General: No focal deficit present.     Mental Status: He is alert and oriented to person, place, and time.  Psychiatric:        Mood and Affect: Mood normal.        Behavior: Behavior normal.        Thought Content: Thought content normal.        Judgment: Judgment normal.        ASSESSMENT & PLAN    Problem List Items Addressed This Visit       Other   Panic attack - Primary   Pt presents with concerns of elevated HR and blood pressure. I do believe  given patient's history of GAD and MDD along with speaking with him about instances where he gets worried about BP this is clearly panic induced that creates a bad cycle - recommended we do xanax  so he just has them on hand to help prevent.       Relevant Medications   ALPRAZolam  (XANAX ) 0.5 MG tablet    No follow-ups on file.      Meds ordered this encounter  Medications   ALPRAZolam  (XANAX ) 0.5 MG tablet    Sig: Take 1 tablet (0.5 mg total) by mouth 2 (two) times daily as needed for anxiety.    Dispense:  30 tablet    Refill:  0    No orders of the defined types were placed in this encounter.    Bernice GORMAN Juneau, DO  [facility] at [facility] [phone] (phone) [phone] (fax)  [facility]

## 2023-05-28 NOTE — Assessment & Plan Note (Signed)
 Pt presents with concerns of elevated HR and blood pressure. I do believe given patient's history of GAD and MDD along with speaking with him about instances where he gets worried about BP this is clearly panic induced that creates a bad cycle - recommended we do xanax  so he just has them on hand to help prevent.

## 2023-06-05 NOTE — Telephone Encounter (Signed)
Patient informed and await MRI results. He will keep appt schld with Dr. Benjamin Stain for 06/13/23.

## 2023-06-05 NOTE — Telephone Encounter (Signed)
Please call radiology and get this moved to the front of the list and then also please call the patient to cancel his appointment because that is what he is asking for in the MyChart message.

## 2023-06-05 NOTE — Telephone Encounter (Signed)
PAP: Application for Marcus Brock has been submitted to [employer] ([employer]), via fax   PLEASE BE ADVISED PT PAGES HAVE BEEN SCANNED IN MEDIA OF CHART

## 2023-06-05 NOTE — Telephone Encounter (Signed)
Called radiology reading room - had MRI cervical spine moved up for reading to stat.  Attempted call to patient. Left a voice mail message requesting a return call to  verify wanting to cancel appt with Dr. Karie Schwalbe as in patient message.

## 2023-06-06 ENCOUNTER — Encounter: Payer: Self-pay | Admitting: Sports Medicine

## 2023-06-06 NOTE — Telephone Encounter (Signed)
PAP: Patient assistance application for Markus Daft has been approved by PAP Companies: [employer] from 06/06/2023 to 04/22/2024. Medication should be delivered to PAP Delivery: Home. For further shipping updates, please contact [employer] ([employer]) at [phone]. Patient ID is: [id number]   PLEASE BE ADVISE LETTER OF APPROVAL IN MEDIA OF CHART

## 2023-06-06 NOTE — Addendum Note (Signed)
Addended by: Monica Becton on: 06/06/2023 08:46 AM   Modules accepted: Orders

## 2023-06-09 ENCOUNTER — Other Ambulatory Visit: Payer: Self-pay | Admitting: Family Medicine

## 2023-06-11 ENCOUNTER — Ambulatory Visit: Payer: Medicare Other

## 2023-06-11 NOTE — Progress Notes (Signed)
HPI  Pt here today to have BP checked. Last OV 138/90. Denies headaches, or medication changes.                        Assessment and Plan:  Pt first BP 148/91 2nd 139/88 which is still not where it needs to be considering pt is diabetic. Jade breeback, PA-C recommends pt start taking losartan 25 mg and f/u in 2wks with a nurse visit.

## 2023-06-12 ENCOUNTER — Encounter: Payer: Self-pay | Admitting: Family Medicine

## 2023-06-12 NOTE — Telephone Encounter (Signed)
Pt shipment 06/11/2023 for BREZTRI([employer])

## 2023-06-13 ENCOUNTER — Ambulatory Visit: Payer: Medicare Other | Admitting: Sports Medicine

## 2023-06-14 ENCOUNTER — Other Ambulatory Visit: Payer: Self-pay | Admitting: Family Medicine

## 2023-06-14 DIAGNOSIS — M545 Low back pain, unspecified: Secondary | ICD-10-CM | POA: Diagnosis not present

## 2023-06-14 DIAGNOSIS — G959 Disease of spinal cord, unspecified: Secondary | ICD-10-CM | POA: Diagnosis not present

## 2023-06-14 MED ORDER — LOSARTAN POTASSIUM 25 MG PO TABS: 25.0000 mg | ORAL_TABLET | Freq: Every day | ORAL | 1 refills | Status: DC

## 2023-06-14 NOTE — Telephone Encounter (Unsigned)
Copied from CRM [id number]. Topic: Clinical - Medication Refill >> Jun 14, 2023 12:39 PM Antony Haste wrote: Most Recent Primary Care Visit:  Provider: Roselyn Reef  Department: [facility]  Visit Type: NURSE VISIT  Date: 06/11/2023  Medication: Losartan 25 mg  Has the patient contacted their pharmacy? Yes, advised it has not been received yet.  (Agent: If no, request that the patient contact the pharmacy for the refill. If patient does not wish to contact the pharmacy document the reason why and proceed with request.) (Agent: If yes, when and what did the pharmacy advise?)  Is this the correct pharmacy for this prescription? Yes If no, delete pharmacy and type the correct one.  This is the patient's preferred pharmacy:  [id number] Lorenza Evangelist, [place] - [address] [place] [address] Phone: [phone] Fax: [phone]   Has the prescription been filled recently? No  Is the patient out of the medication? Yes  Has the patient been seen for an appointment in the last year OR does the patient have an upcoming appointment? Yes  Can we respond through MyChart? No, callback preferred.  Agent: Please be advised that Rx refills may take up to 3 business days. We ask that you follow-up with your pharmacy.

## 2023-06-25 ENCOUNTER — Other Ambulatory Visit: Payer: Self-pay | Admitting: Orthopedic Surgery

## 2023-06-26 ENCOUNTER — Ambulatory Visit: Payer: Medicare Other

## 2023-07-07 ENCOUNTER — Other Ambulatory Visit: Payer: Self-pay | Admitting: Physician Assistant

## 2023-07-19 NOTE — Pre-Procedure Instructions (Signed)
 Surgical Instructions   Your procedure is scheduled on July 31, 2023. Report to [facility] Main Entrance "A" at 6:30 A.M., then check in with the Admitting office. Any questions or running late day of surgery: call [phone]  Questions prior to your surgery date: call [phone], Monday-Friday, 8am-4pm. If you experience any cold or flu symptoms such as cough, fever, chills, shortness of breath, etc. between now and your scheduled surgery, please notify us at the above number.     Remember:  Do not eat after midnight the night before your surgery  You may drink clear liquids until 5:30 AM the morning of your surgery.   Clear liquids allowed are: Water, Non-Citrus Juices (without pulp), Carbonated Beverages, Clear Tea (no milk, honey, etc.), Black Coffee Only (NO MILK, CREAM OR POWDERED CREAMER of any kind), and Gatorade.  Patient Instructions  The night before surgery:  No food after midnight. ONLY clear liquids after midnight  The day of surgery (if you have diabetes): Drink ONE (1) 12 oz G2 given to you in your pre admission testing appointment by 5:30 AM the morning of surgery. Drink in one sitting. Do not sip.  This drink was given to you during your hospital  pre-op appointment visit.  Nothing else to drink after completing the  12 oz bottle of G2.         If you have questions, please contact your surgeon's office.     Take these medicines the morning of surgery with A SIP OF WATER: Budeson-Glycopyrrol-Formoterol (BREZTRI AEROSPHERE) inhaler buPROPion (WELLBUTRIN XL)  DULoxetine (CYMBALTA)  gabapentin (NEURONTIN)  tamsulosin [facility])    May take these medicines IF NEEDED: acetaminophen-codeine (TYLENOL #2)  albuterol (VENTOLIN HFA) inhaler - please bring your inhaler with you morning of surgery ALPRAZolam (XANAX)  EPINEPHrine Pen famotidine (PEPCID)  ipratropium-albuterol (DUONEB) inhaler tiZANidine (ZANAFLEX)    Follow your surgeon's instructions on when  to stop Aspirin.  If no instructions were given by your surgeon then you will need to call the office to get those instructions.     One week prior to surgery, STOP taking any Aleve, Naproxen, Ibuprofen, Motrin, Advil, Goody's, BC's, all herbal medications, fish oil, and non-prescription vitamins. This includes your medication: meloxicam (MOBIC)    WHAT DO I DO ABOUT MY DIABETES MEDICATION?   Do not take metFORMIN (GLUCOPHAGE) the morning of surgery.   HOW TO MANAGE YOUR DIABETES BEFORE AND AFTER SURGERY  Why is it important to control my blood sugar before and after surgery? Improving blood sugar levels before and after surgery helps healing and can limit problems. A way of improving blood sugar control is eating a healthy diet by:  Eating less sugar and carbohydrates  Increasing activity/exercise  Talking with your doctor about reaching your blood sugar goals High blood sugars (greater than 180 mg/dL) can raise your risk of infections and slow your recovery, so you will need to focus on controlling your diabetes during the weeks before surgery. Make sure that the doctor who takes care of your diabetes knows about your planned surgery including the date and location.  How do I manage my blood sugar before surgery? Check your blood sugar at least 4 times a day, starting 2 days before surgery, to make sure that the level is not too high or low.  Check your blood sugar the morning of your surgery when you wake up and every 2 hours until you get to the Short Stay unit.  If your blood sugar is less  than 70 mg/dL, you will need to treat for low blood sugar: Do not take insulin. Treat a low blood sugar (less than 70 mg/dL) with  cup of clear juice (cranberry or apple), 4 glucose tablets, OR glucose gel. Recheck blood sugar in 15 minutes after treatment (to make sure it is greater than 70 mg/dL). If your blood sugar is not greater than 70 mg/dL on recheck, call [phone] for further  instructions. Report your blood sugar to the short stay nurse when you get to Short Stay.  If you are admitted to the hospital after surgery: Your blood sugar will be checked by the staff and you will probably be given insulin after surgery (instead of oral diabetes medicines) to make sure you have good blood sugar levels. The goal for blood sugar control after surgery is 80-180 mg/dL.                      Do NOT Smoke (Tobacco/Vaping) for 24 hours prior to your procedure.  If you use a CPAP at night, you may bring your mask/headgear for your overnight stay.   You will be asked to remove any contacts, glasses, piercing's, hearing aid's, dentures/partials prior to surgery. Please bring cases for these items if needed.    Patients discharged the day of surgery will not be allowed to drive home, and someone needs to stay with them for 24 hours.  SURGICAL WAITING ROOM VISITATION Patients may have no more than 2 support people in the waiting area - these visitors may rotate.   Pre-op nurse will coordinate an appropriate time for 1 ADULT support person, who may not rotate, to accompany patient in pre-op.  Children under the age of 72 must have an adult with them who is not the patient and must remain in the main waiting area with an adult.  If the patient needs to stay at the hospital during part of their recovery, the visitor guidelines for inpatient rooms apply.  Please refer to the [facility] website for the visitor guidelines for any additional information.   If you received a COVID test during your pre-op visit  it is requested that you wear a mask when out in public, stay away from anyone that may not be feeling well and notify your surgeon if you develop symptoms. If you have been in contact with anyone that has tested positive in the last 10 days please notify you surgeon.      Pre-operative 5 CHG Bathing Instructions   You can play a key role in reducing the risk of infection  after surgery. Your skin needs to be as free of germs as possible. You can reduce the number of germs on your skin by washing with CHG (chlorhexidine gluconate) soap before surgery. CHG is an antiseptic soap that kills germs and continues to kill germs even after washing.   DO NOT use if you have an allergy to chlorhexidine/CHG or antibacterial soaps. If your skin becomes reddened or irritated, stop using the CHG and notify one of our RNs at [phone].   Please shower with the CHG soap starting 4 days before surgery using the following schedule:     Please keep in mind the following:  DO NOT shave, including legs and underarms, starting the day of your first shower.   You may shave your face at any point before/day of surgery.  Place clean sheets on your bed the day you start using CHG soap. Use a clean  washcloth (not used since being washed) for each shower. DO NOT sleep with pets once you start using the CHG.   CHG Shower Instructions:  Wash your face and private area with normal soap. If you choose to wash your hair, wash first with your normal shampoo.  After you use shampoo/soap, rinse your hair and body thoroughly to remove shampoo/soap residue.  Turn the water OFF and apply about 3 tablespoons (45 ml) of CHG soap to a CLEAN washcloth.  Apply CHG soap ONLY FROM YOUR NECK DOWN TO YOUR TOES (washing for 3-5 minutes)  DO NOT use CHG soap on face, private areas, open wounds, or sores.  Pay special attention to the area where your surgery is being performed.  If you are having back surgery, having someone wash your back for you may be helpful. Wait 2 minutes after CHG soap is applied, then you may rinse off the CHG soap.  Pat dry with a clean towel  Put on clean clothes/pajamas   If you choose to wear lotion, please use ONLY the CHG-compatible lotions that are listed below.  Additional instructions for the day of surgery: DO NOT APPLY any lotions, deodorants, cologne, or perfumes.    Do not bring valuables to the hospital. [facility] is not responsible for any belongings/valuables. Do not wear nail polish, gel polish, artificial nails, or any other type of covering on natural nails (fingers and toes) Do not wear jewelry or makeup Put on clean/comfortable clothes.  Please brush your teeth.  Ask your nurse before applying any prescription medications to the skin.     CHG Compatible Lotions   Aveeno Moisturizing lotion  Cetaphil Moisturizing Cream  Cetaphil Moisturizing Lotion  Clairol Herbal Essence Moisturizing Lotion, Dry Skin  Clairol Herbal Essence Moisturizing Lotion, Extra Dry Skin  Clairol Herbal Essence Moisturizing Lotion, Normal Skin  Curel Age Defying Therapeutic Moisturizing Lotion with Alpha Hydroxy  Curel Extreme Care Body Lotion  Curel Soothing Hands Moisturizing Hand Lotion  Curel Therapeutic Moisturizing Cream, Fragrance-Free  Curel Therapeutic Moisturizing Lotion, Fragrance-Free  Curel Therapeutic Moisturizing Lotion, Original Formula  Eucerin Daily Replenishing Lotion  Eucerin Dry Skin Therapy Plus Alpha Hydroxy Crme  Eucerin Dry Skin Therapy Plus Alpha Hydroxy Lotion  Eucerin Original Crme  Eucerin Original Lotion  Eucerin Plus Crme Eucerin Plus Lotion  Eucerin TriLipid Replenishing Lotion  Keri Anti-Bacterial Hand Lotion  Keri Deep Conditioning Original Lotion Dry Skin Formula Softly Scented  Keri Deep Conditioning Original Lotion, Fragrance Free Sensitive Skin Formula  Keri Lotion Fast Absorbing Fragrance Free Sensitive Skin Formula  Keri Lotion Fast Absorbing Softly Scented Dry Skin Formula  Keri Original Lotion  Keri Skin Renewal Lotion Keri Silky Smooth Lotion  Keri Silky Smooth Sensitive Skin Lotion  Nivea Body Creamy Conditioning Oil  Nivea Body Extra Enriched Lotion  Nivea Body Original Lotion  Nivea Body Sheer Moisturizing Lotion Nivea Crme  Nivea Skin Firming Lotion  NutraDerm 30 Skin Lotion  NutraDerm Skin Lotion   NutraDerm Therapeutic Skin Cream  NutraDerm Therapeutic Skin Lotion  ProShield Protective Hand Cream  Provon moisturizing lotion  Please read over the following fact sheets that you were given.

## 2023-07-22 ENCOUNTER — Encounter (HOSPITAL_COMMUNITY)
Admission: RE | Admit: 2023-07-22 | Discharge: 2023-07-22 | Disposition: A | Discharge status: Home / Self Care | Source: Ambulatory Visit | Attending: Orthopedic Surgery | Admitting: Orthopedic Surgery

## 2023-07-22 ENCOUNTER — Encounter (HOSPITAL_COMMUNITY): Payer: Self-pay

## 2023-07-22 ENCOUNTER — Other Ambulatory Visit: Payer: Self-pay

## 2023-07-22 VITALS — BP 157/100 | HR 66 | Temp 98.2°F | Resp 18 | Ht 71.0 in | Wt 190.6 lb

## 2023-07-22 DIAGNOSIS — Z01812 Encounter for preprocedural laboratory examination: Secondary | ICD-10-CM | POA: Diagnosis not present

## 2023-07-22 DIAGNOSIS — I10 Essential (primary) hypertension: Secondary | ICD-10-CM | POA: Insufficient documentation

## 2023-07-22 DIAGNOSIS — E1140 Type 2 diabetes mellitus with diabetic neuropathy, unspecified: Secondary | ICD-10-CM | POA: Insufficient documentation

## 2023-07-22 DIAGNOSIS — Z87891 Personal history of nicotine dependence: Secondary | ICD-10-CM | POA: Insufficient documentation

## 2023-07-22 DIAGNOSIS — F410 Panic disorder [episodic paroxysmal anxiety] without agoraphobia: Secondary | ICD-10-CM | POA: Diagnosis not present

## 2023-07-22 DIAGNOSIS — Z981 Arthrodesis status: Secondary | ICD-10-CM | POA: Insufficient documentation

## 2023-07-22 DIAGNOSIS — K219 Gastro-esophageal reflux disease without esophagitis: Secondary | ICD-10-CM | POA: Diagnosis not present

## 2023-07-22 DIAGNOSIS — Z01818 Encounter for other preprocedural examination: Secondary | ICD-10-CM | POA: Insufficient documentation

## 2023-07-22 DIAGNOSIS — Z9889 Other specified postprocedural states: Secondary | ICD-10-CM | POA: Diagnosis not present

## 2023-07-22 DIAGNOSIS — M5412 Radiculopathy, cervical region: Secondary | ICD-10-CM | POA: Diagnosis not present

## 2023-07-22 DIAGNOSIS — R9431 Abnormal electrocardiogram [ECG] [EKG]: Secondary | ICD-10-CM | POA: Diagnosis not present

## 2023-07-22 DIAGNOSIS — Z01810 Encounter for preprocedural cardiovascular examination: Secondary | ICD-10-CM | POA: Diagnosis not present

## 2023-07-22 DIAGNOSIS — E119 Type 2 diabetes mellitus without complications: Secondary | ICD-10-CM

## 2023-07-22 DIAGNOSIS — K449 Diaphragmatic hernia without obstruction or gangrene: Secondary | ICD-10-CM | POA: Diagnosis not present

## 2023-07-22 DIAGNOSIS — J4489 Other specified chronic obstructive pulmonary disease: Secondary | ICD-10-CM | POA: Diagnosis not present

## 2023-07-22 HISTORY — DX: Type 2 diabetes mellitus with diabetic neuropathy, unspecified: E11.40

## 2023-07-22 HISTORY — DX: Personal history of other diseases of the digestive system: Z87.19

## 2023-07-22 HISTORY — DX: Essential (primary) hypertension: I10

## 2023-07-22 HISTORY — DX: Pneumonia, unspecified organism: J18.9

## 2023-07-22 LAB — CBC
HCT: 47.2 % (ref 39.0–52.0)
Hemoglobin: 15.9 g/dL (ref 13.0–17.0)
MCH: 30.9 pg (ref 26.0–34.0)
MCHC: 33.7 g/dL (ref 30.0–36.0)
MCV: 91.8 fL (ref 80.0–100.0)
Platelets: 261 10*3/uL (ref 150–400)
RBC: 5.14 MIL/uL (ref 4.22–5.81)
RDW: 12.8 % (ref 11.5–15.5)
WBC: 11.3 10*3/uL — ABNORMAL HIGH (ref 4.0–10.5)
nRBC: 0 % (ref 0.0–0.2)

## 2023-07-22 LAB — BASIC METABOLIC PANEL WITH GFR
Anion gap: 9 (ref 5–15)
BUN: 12 mg/dL (ref 8–23)
CO2: 28 mmol/L (ref 22–32)
Calcium: 9.7 mg/dL (ref 8.9–10.3)
Chloride: 104 mmol/L (ref 98–111)
Creatinine, Ser: 0.9 mg/dL (ref 0.61–1.24)
GFR, Estimated: >60 mL/min (ref >60)
Glucose, Bld: 124 mg/dL — ABNORMAL HIGH (ref 70–99)
Potassium: 3.7 mmol/L (ref 3.5–5.1)
Sodium: 141 mmol/L (ref 135–145)

## 2023-07-22 LAB — SURGICAL PCR SCREEN
MRSA, PCR: NEGATIVE
Staphylococcus aureus: NEGATIVE

## 2023-07-22 LAB — HEMOGLOBIN A1C
Hgb A1c MFr Bld: 5.7 % — ABNORMAL HIGH (ref 4.8–5.6)
Mean Plasma Glucose: 116.89 mg/dL

## 2023-07-22 LAB — GLUCOSE, CAPILLARY: Glucose-Capillary: 140 mg/dL — ABNORMAL HIGH (ref 70–99)

## 2023-07-22 NOTE — Progress Notes (Addendum)
 PCP - Morey Hummingbird, DO Cardiologist - Per pt, many years ago.  PPM/ICD - Denies Device Orders - n/a Rep Notified - n/a  Chest x-ray - 05/13/2023 EKG - 07/22/2023 Stress Test - Per pt, many years ago. Result normal ECHO - Per pt, many years ago. Result normal Cardiac Cath - Denies  Sleep Study - Denies CPAP - n/a  Pt is DM2. He checks his blood sugar a few times per week. Normal fasting range is 140s. CBG at pre-op appointment 140. A1c result pending.  Last dose of GLP1 agonist- n/a GLP1 instructions: n/a  Blood Thinner Instructions: n/a Aspirin Instructions: Pt instructed to call surgeon's office for ASA instructions  ERAS Protcol - Clear liquids until 0530 morning of surgery PRE-SURGERY Ensure or G2- G2 given to pt with instructions  COVID TEST- n/a   Anesthesia review: Yes. EKG review. Hx of HTN, Asthma, COPD and DM2.  Patient denies shortness of breath, fever, cough and chest pain at PAT appointment. Pt denies any respiratory illness/infection in the last two months.   All instructions explained to the patient, with a verbal understanding of the material. Patient agrees to go over the instructions while at home for a better understanding. Patient also instructed to self quarantine after being tested for COVID-19. The opportunity to ask questions was provided.

## 2023-07-23 NOTE — Progress Notes (Signed)
 Anesthesia Chart Review:  Case: [id number] Date/Time: 07/31/23 0815   Procedure: ANTERIOR CERVICAL DECOMPRESSION/DISCECTOMY FUSION 1 LEVEL - ANTERIOR CERVICAL DECOMPRESSION FUSION CERVICAL 3- CERVICAL 4 WITH INSTRUMENTATION AND ALLOGRAFT   Anesthesia type: General   Diagnosis: Cervical radiculopathy [M54.12]   Pre-op diagnosis: CERVICAL RADICULOPATHY   [facility] ROOM 05 / [facility]   Surgeons: Estill Bamberg, MD       DISCUSSION: Patient is a 68 year old male scheduled for the above procedure.  History includes former smoker (quit 04/23/11), HTN, DM2 (with neuropathy), GERD, hiatal hernia, COPD, asthma, anxiety with panic attacks, spinal surgery (C4-6 ACDF), inguinal hernia (s/p bilateral IHR 06/04/22), appendectomy (01/29/17).  PCP visit with Dr. Tamera Punt on 05/28/23. He was noting hight home BP readings which created some worsening anxiety which in turn exacerbated his HTN. He was prescribed Xanax 0.5 mg BID as needed. He followed up with some home BP readings of 148/91 and 139/88, and losartan 25 mg daily resumed 06/12/23. 07/22/23 EKG showed SB at 59 bpm, poor r wave progression. He denied chest pain and SOB at PAT RN visit.   Pulmonary medication includes albuterol HFA and Duoneb as needed, Breztri BID. Last COPD exacerbation 04/2023.   A1c 5.7% on 07/22/23. He is on metformin 500 mg daily. Also on ASA 81 mg daily, Lipitor 40 mg daily.   Anesthesia team to evaluate on the day of surgery.    VS: BP (!) 157/100   Pulse 66   Temp 36.8 C   Resp 18   Ht 5\' 11"  (1.803 m)   Wt 86.5 kg   SpO2 99%   BMI 26.58 kg/m  BP Readings from Last 3 Encounters:  07/22/23 (!) 157/100  06/11/23 139/88  05/28/23 (!) 138/90     PROVIDERS: Charlton Amor, DO is PCP, established since 12/05/22.   LABS: Labs reviewed: Acceptable for surgery. LFTs normal 03/11/23.  (all labs ordered are listed, but only abnormal results are displayed)  Labs Reviewed  GLUCOSE, CAPILLARY - Abnormal; Notable for the  following components:      Result Value   Glucose-Capillary 140 (*)    All other components within normal limits  HEMOGLOBIN A1C - Abnormal; Notable for the following components:   Hgb A1c MFr Bld 5.7 (*)    All other components within normal limits  BASIC METABOLIC PANEL WITH GFR - Abnormal; Notable for the following components:   Glucose, Bld 124 (*)    All other components within normal limits  CBC - Abnormal; Notable for the following components:   WBC 11.3 (*)    All other components within normal limits  SURGICAL PCR SCREEN     IMAGES: MRI C-spine 05/28/23: IMPRESSION: 1. Progressive cervical disc and facet degeneration since 2015, most notable at C3-4 where there is severe spinal stenosis, moderate to severe bilateral neural foraminal stenosis, and new cord signal abnormality which may reflect mild edema or myelomalacia. 2. Mild spinal stenosis and moderate to severe left neural foraminal stenosis at C6-7. 3. C4-C6 ACDF. Moderate to severe right neural foraminal stenosis at C4-5 and mild residual spinal stenosis and moderate left foraminal stenosis at C5-6.  CXR 05/13/23: FINDINGS: Hyperinflation. No consolidation, pneumothorax or effusion. No edema. Slight peribronchial thickening. Normal cardiopericardial silhouette. Fixation hardware along the lower cervical spine. IMPRESSION: Hyperinflation. Slight peribronchial thickening. No acute cardiopulmonary disease.  CT C-spine 03/11/23: IMPRESSION: 1. Prior C4-C6 ACDF with partial interbody fusion at both levels. Mild subsidence of both interbody grafts. 2. Multilevel cervical spondylosis as  described above. Moderate neuroforaminal stenosis on the right at C4-C5 and on the left at C6-C7.   EKG: 07/22/23: Sinus bradycardia at 59 bpm Possible Anterior infarct , age undetermined Abnormal ECG No previous ECGs available Confirmed by Thurmon Fair [phone]) on 07/22/2023 10:10:05 PM  EKG narrative from 01/27/22  ([facility]): Sinus rhythm  Possible Anterior infarct , age undetermined  No previous ECGs available  Confirmed by fellow Delia Chimes  [id number]  on 01-26-2022 11 41 25 AM  Confirmed by Palamaner Elly Modena  [phone]  on 01-27-2022 1 57 18 PM    CV:  He reported a remote history of normal stress test and echocardiogram.   Past Medical History:  Diagnosis Date   Allergy 1980   Anxiety 1982   Arthritis 1995   Asthma    COPD (chronic obstructive pulmonary disease) (HCC) 2024   Depression 1982   Diabetes mellitus without complication (HCC)    Diabetic neuropathy (HCC)    feet   GERD (gastroesophageal reflux disease) 1992   History of hiatal hernia    Hypertension    Neuromuscular disorder (HCC)    Neuropathy bilateral arms from cervical spine   Pneumonia    Ulcer 1988    Past Surgical History:  Procedure Laterality Date   ANKLE SURGERY Left    As a child. "Almost sliced in half"   APPENDECTOMY  01/2017   CHOLECYSTECTOMY  1995   COLON RESECTION N/A 04/2003   COLONOSCOPY  07/2002   12/2009; 12/2014   GANGLION CYST EXCISION Left 01/2002   left elbow   INGUINAL HERNIA REPAIR Right 2024   KNEE ARTHROSCOPY WITH MEDIAL MENISECTOMY Left 07/2017   NECK SURGERY  01/2014   NEVUS EXCISION     Back   ROTATOR CUFF REPAIR Right 12/2001   SPINE SURGERY  2018   Cervical Neck Fusion   TOOTH EXTRACTION     15 teeth removed due to infection   VASECTOMY  1996    MEDICATIONS:  acetaminophen-codeine (TYLENOL #2) 300-15 MG tablet   albuterol (VENTOLIN HFA) 108 (90 Base) MCG/ACT inhaler   ALPRAZolam (XANAX) 0.5 MG tablet   aspirin EC 81 MG tablet   atorvastatin (LIPITOR) 40 MG tablet   Budeson-Glycopyrrol-Formoterol (BREZTRI AEROSPHERE) 160-9-4.8 MCG/ACT AERO   buPROPion (WELLBUTRIN XL) 150 MG 24 hr tablet   Cholecalciferol (VITAMIN D3) 50 MCG (2000 UT) capsule   DULoxetine (CYMBALTA) 60 MG capsule   EPINEPHrine 0.3 mg/0.3 mL IJ SOAJ injection   famotidine (PEPCID) 20 MG tablet    gabapentin (NEURONTIN) 600 MG tablet   ipratropium-albuterol (DUONEB) 0.5-2.5 (3) MG/3ML SOLN   losartan (COZAAR) 25 MG tablet   meloxicam (MOBIC) 15 MG tablet   metFORMIN (GLUCOPHAGE) 500 MG tablet   Multiple Vitamins-Minerals (MULTIVITAMIN GUMMIES MENS PO)   tamsulosin (FLOMAX) 0.4 MG CAPS capsule   tiZANidine (ZANAFLEX) 4 MG tablet   No current facility-administered medications for this encounter.  Advised at PAT to follow surgeon recommendations regarding perioperative ASA.    Shonna Chock, PA-C Surgical Short Stay/Anesthesiology [facility] Phone [phone] [facility] Phone [phone] 07/23/2023 12:34 PM

## 2023-07-23 NOTE — Anesthesia Preprocedure Evaluation (Addendum)
 Anesthesia Evaluation  Patient identified by MRN, date of birth, ID band Patient awake    Reviewed: Allergy & Precautions, H&P , NPO status , Patient's Chart, lab work & pertinent test results  Airway Mallampati: I  TM Distance: >3 FB Neck ROM: Full    Dental no notable dental hx. (+) Edentulous Upper, Edentulous Lower   Pulmonary neg pulmonary ROS, asthma , pneumonia, COPD, former smoker   Pulmonary exam normal breath sounds clear to auscultation       Cardiovascular Exercise Tolerance: Good hypertension, negative cardio ROS Normal cardiovascular exam Rhythm:Regular Rate:Normal     Neuro/Psych  PSYCHIATRIC DISORDERS Anxiety Depression     Neuromuscular disease negative neurological ROS  negative psych ROS   GI/Hepatic negative GI ROS, Neg liver ROS, hiatal hernia,GERD  ,,  Endo/Other  negative endocrine ROSdiabetes, Type 2    Renal/GU negative Renal ROS  negative genitourinary   Musculoskeletal negative musculoskeletal ROS (+) Arthritis , Osteoarthritis,    Abdominal   Peds negative pediatric ROS (+)  Hematology negative hematology ROS (+)   Anesthesia Other Findings   Reproductive/Obstetrics negative OB ROS                             Anesthesia Physical Anesthesia Plan  ASA: 3  Anesthesia Plan: General   Post-op Pain Management: Tylenol PO (pre-op)*   Induction: Intravenous  PONV Risk Score and Plan: 2 and Ondansetron and Dexamethasone  Airway Management Planned: Oral ETT and Video Laryngoscope Planned  Additional Equipment: None  Intra-op Plan:   Post-operative Plan: Extubation in OR  Informed Consent: I have reviewed the patients History and Physical, chart, labs and discussed the procedure including the risks, benefits and alternatives for the proposed anesthesia with the patient or authorized representative who has indicated his/her understanding and acceptance.        Plan Discussed with: Anesthesiologist and CRNA  Anesthesia Plan Comments: (PAT note written 07/23/2023 by Shonna Chock, PA-C.  DISCUSSION: Patient is a 68 year old male scheduled for the above procedure.   History includes former smoker (quit 04/23/11), HTN, DM2 (with neuropathy), GERD, hiatal hernia, COPD, asthma, anxiety with panic attacks, spinal surgery (C4-6 ACDF), inguinal hernia (s/p bilateral IHR 06/04/22), appendectomy (01/29/17).   PCP visit with Dr. Tamera Punt on 05/28/23. He was noting hight home BP readings which created some worsening anxiety which in turn exacerbated his HTN. He was prescribed Xanax 0.5 mg BID as needed. He followed up with some home BP readings of 148/91 and 139/88, and losartan 25 mg daily resumed 06/12/23. 07/22/23 EKG showed SB at 59 bpm, poor r wave progression. He denied chest pain and SOB at PAT RN visit.    Pulmonary medication includes albuterol HFA and Duoneb as needed, Breztri BID. Last COPD exacerbation 04/2023.    A1c 5.7% on 07/22/23. He is on metformin 500 mg daily. Also on ASA 81 mg daily, Lipitor 40 mg daily.    Anesthesia team to evaluate on the day of surgery.  )       Anesthesia Quick Evaluation

## 2023-07-29 DIAGNOSIS — G959 Disease of spinal cord, unspecified: Secondary | ICD-10-CM | POA: Diagnosis not present

## 2023-07-31 ENCOUNTER — Encounter (HOSPITAL_COMMUNITY)
Admission: RE | Disposition: A | Discharge status: Home / Self Care | Payer: Self-pay | Source: Home / Self Care | Attending: Orthopedic Surgery

## 2023-07-31 ENCOUNTER — Ambulatory Visit (HOSPITAL_COMMUNITY): Payer: Self-pay | Admitting: Vascular Surgery

## 2023-07-31 ENCOUNTER — Ambulatory Visit (HOSPITAL_COMMUNITY)
Admission: RE | Admit: 2023-07-31 | Discharge: 2023-07-31 | Disposition: A | Discharge status: Home / Self Care | Attending: Orthopedic Surgery | Admitting: Orthopedic Surgery

## 2023-07-31 ENCOUNTER — Encounter (HOSPITAL_COMMUNITY): Payer: Self-pay | Admitting: Orthopedic Surgery

## 2023-07-31 ENCOUNTER — Ambulatory Visit (HOSPITAL_COMMUNITY)

## 2023-07-31 ENCOUNTER — Other Ambulatory Visit: Payer: Self-pay

## 2023-07-31 ENCOUNTER — Ambulatory Visit (HOSPITAL_BASED_OUTPATIENT_CLINIC_OR_DEPARTMENT_OTHER): Payer: Self-pay | Admitting: Anesthesiology

## 2023-07-31 DIAGNOSIS — M4802 Spinal stenosis, cervical region: Secondary | ICD-10-CM | POA: Insufficient documentation

## 2023-07-31 DIAGNOSIS — E1140 Type 2 diabetes mellitus with diabetic neuropathy, unspecified: Secondary | ICD-10-CM | POA: Insufficient documentation

## 2023-07-31 DIAGNOSIS — Z87891 Personal history of nicotine dependence: Secondary | ICD-10-CM | POA: Diagnosis not present

## 2023-07-31 DIAGNOSIS — G992 Myelopathy in diseases classified elsewhere: Secondary | ICD-10-CM | POA: Diagnosis not present

## 2023-07-31 DIAGNOSIS — M5001 Cervical disc disorder with myelopathy,  high cervical region: Secondary | ICD-10-CM | POA: Diagnosis not present

## 2023-07-31 DIAGNOSIS — K219 Gastro-esophageal reflux disease without esophagitis: Secondary | ICD-10-CM | POA: Diagnosis not present

## 2023-07-31 DIAGNOSIS — I10 Essential (primary) hypertension: Secondary | ICD-10-CM

## 2023-07-31 DIAGNOSIS — F418 Other specified anxiety disorders: Secondary | ICD-10-CM | POA: Diagnosis not present

## 2023-07-31 DIAGNOSIS — J449 Chronic obstructive pulmonary disease, unspecified: Secondary | ICD-10-CM

## 2023-07-31 DIAGNOSIS — J4489 Other specified chronic obstructive pulmonary disease: Secondary | ICD-10-CM | POA: Insufficient documentation

## 2023-07-31 DIAGNOSIS — M5412 Radiculopathy, cervical region: Secondary | ICD-10-CM | POA: Diagnosis not present

## 2023-07-31 DIAGNOSIS — G9520 Unspecified cord compression: Secondary | ICD-10-CM | POA: Diagnosis not present

## 2023-07-31 DIAGNOSIS — E119 Type 2 diabetes mellitus without complications: Secondary | ICD-10-CM

## 2023-07-31 DIAGNOSIS — G9589 Other specified diseases of spinal cord: Secondary | ICD-10-CM | POA: Insufficient documentation

## 2023-07-31 DIAGNOSIS — Z981 Arthrodesis status: Secondary | ICD-10-CM | POA: Diagnosis not present

## 2023-07-31 HISTORY — PX: ANTERIOR CERVICAL DECOMP/DISCECTOMY FUSION: SHX1161

## 2023-07-31 LAB — GLUCOSE, CAPILLARY
Glucose-Capillary: 124 mg/dL — ABNORMAL HIGH (ref 70–99)
Glucose-Capillary: 150 mg/dL — ABNORMAL HIGH (ref 70–99)

## 2023-07-31 SURGERY — ANTERIOR CERVICAL DECOMPRESSION/DISCECTOMY FUSION 1 LEVEL
Anesthesia: General

## 2023-07-31 MED ORDER — BUPIVACAINE-EPINEPHRINE 0.25% -1:200000 IJ SOLN
INTRAMUSCULAR | Status: DC | PRN
  Administered 2023-07-31: 6 mL

## 2023-07-31 MED ORDER — HYDROCODONE-ACETAMINOPHEN 5-325 MG PO TABS: 1.0000 | ORAL_TABLET | Freq: Four times a day (QID) | ORAL | 0 refills | Status: DC | PRN

## 2023-07-31 MED ORDER — PHENYLEPHRINE 80 MCG/ML (10ML) SYRINGE FOR IV PUSH (FOR BLOOD PRESSURE SUPPORT)
PREFILLED_SYRINGE | INTRAVENOUS | Status: DC | PRN
  Administered 2023-07-31 (×2): 160 ug via INTRAVENOUS

## 2023-07-31 MED ORDER — PROPOFOL 10 MG/ML IV BOLUS
INTRAVENOUS | Status: DC | PRN
  Administered 2023-07-31: 150 mg via INTRAVENOUS
  Administered 2023-07-31: 50 mg via INTRAVENOUS

## 2023-07-31 MED ORDER — PROPOFOL 10 MG/ML IV BOLUS
INTRAVENOUS | Status: AC
  Filled 2023-07-31: qty 20

## 2023-07-31 MED ORDER — ORAL CARE MOUTH RINSE: 15.0000 mL | Freq: Once | OROMUCOSAL | Status: AC

## 2023-07-31 MED ORDER — FENTANYL CITRATE (PF) 250 MCG/5ML IJ SOLN
INTRAMUSCULAR | Status: AC
  Filled 2023-07-31: qty 5

## 2023-07-31 MED ORDER — INSULIN ASPART 100 UNIT/ML IJ SOLN: 0.0000 [IU] | INTRAMUSCULAR | Status: DC | PRN

## 2023-07-31 MED ORDER — ACETAMINOPHEN 500 MG PO TABS
1000.0000 mg | ORAL_TABLET | Freq: Once | ORAL | Status: AC
  Administered 2023-07-31: 1000 mg via ORAL
  Filled 2023-07-31: qty 2

## 2023-07-31 MED ORDER — MIDAZOLAM HCL 2 MG/2ML IJ SOLN
INTRAMUSCULAR | Status: AC
  Filled 2023-07-31: qty 2

## 2023-07-31 MED ORDER — LIDOCAINE 2% (20 MG/ML) 5 ML SYRINGE
INTRAMUSCULAR | Status: AC
  Filled 2023-07-31: qty 5

## 2023-07-31 MED ORDER — FENTANYL CITRATE (PF) 250 MCG/5ML IJ SOLN
INTRAMUSCULAR | Status: DC | PRN
  Administered 2023-07-31 (×3): 50 ug via INTRAVENOUS

## 2023-07-31 MED ORDER — AMISULPRIDE (ANTIEMETIC) 5 MG/2ML IV SOLN
10.0000 mg | Freq: Once | INTRAVENOUS | Status: AC
Start: 2023-07-31 — End: 2023-07-31
  Administered 2023-07-31: 10 mg via INTRAVENOUS

## 2023-07-31 MED ORDER — ROCURONIUM BROMIDE 10 MG/ML (PF) SYRINGE
PREFILLED_SYRINGE | INTRAVENOUS | Status: DC | PRN
  Administered 2023-07-31: 20 mg via INTRAVENOUS
  Administered 2023-07-31: 70 mg via INTRAVENOUS

## 2023-07-31 MED ORDER — FENTANYL CITRATE (PF) 100 MCG/2ML IJ SOLN
25.0000 ug | INTRAMUSCULAR | Status: DC | PRN
  Administered 2023-07-31: 50 ug via INTRAVENOUS

## 2023-07-31 MED ORDER — OXYCODONE HCL 5 MG PO TABS
ORAL_TABLET | ORAL | Status: AC
  Filled 2023-07-31: qty 1

## 2023-07-31 MED ORDER — THROMBIN 20000 UNITS EX SOLR
CUTANEOUS | Status: AC
  Filled 2023-07-31: qty 20000

## 2023-07-31 MED ORDER — ONDANSETRON HCL 4 MG/2ML IJ SOLN: 4.0000 mg | Freq: Once | INTRAMUSCULAR | Status: DC | PRN

## 2023-07-31 MED ORDER — BUPIVACAINE-EPINEPHRINE (PF) 0.25% -1:200000 IJ SOLN
INTRAMUSCULAR | Status: AC
Start: 2023-07-31 — End: ?
  Filled 2023-07-31: qty 30

## 2023-07-31 MED ORDER — MIDAZOLAM HCL 2 MG/2ML IJ SOLN
INTRAMUSCULAR | Status: DC | PRN
  Administered 2023-07-31: 2 mg via INTRAVENOUS

## 2023-07-31 MED ORDER — CEFAZOLIN SODIUM-DEXTROSE 2-4 GM/100ML-% IV SOLN
2.0000 g | INTRAVENOUS | Status: AC
  Administered 2023-07-31: 2 g via INTRAVENOUS
  Filled 2023-07-31: qty 100

## 2023-07-31 MED ORDER — OXYCODONE HCL 5 MG/5ML PO SOLN: 5.0000 mg | Freq: Once | ORAL | Status: AC | PRN

## 2023-07-31 MED ORDER — THROMBIN 20000 UNITS EX SOLR
CUTANEOUS | Status: DC | PRN
  Administered 2023-07-31: 20 mL

## 2023-07-31 MED ORDER — AMISULPRIDE (ANTIEMETIC) 5 MG/2ML IV SOLN
INTRAVENOUS | Status: AC
  Filled 2023-07-31: qty 4

## 2023-07-31 MED ORDER — OXYCODONE HCL 5 MG PO TABS
5.0000 mg | ORAL_TABLET | Freq: Once | ORAL | Status: AC | PRN
  Administered 2023-07-31: 5 mg via ORAL

## 2023-07-31 MED ORDER — FENTANYL CITRATE (PF) 100 MCG/2ML IJ SOLN
INTRAMUSCULAR | Status: AC
  Filled 2023-07-31: qty 2

## 2023-07-31 MED ORDER — LIDOCAINE 2% (20 MG/ML) 5 ML SYRINGE
INTRAMUSCULAR | Status: DC | PRN
  Administered 2023-07-31: 100 mg via INTRAVENOUS

## 2023-07-31 MED ORDER — TIZANIDINE HCL 4 MG PO TABS: 4.0000 mg | ORAL_TABLET | Freq: Four times a day (QID) | ORAL | 0 refills | Status: AC | PRN

## 2023-07-31 MED ORDER — ROCURONIUM BROMIDE 10 MG/ML (PF) SYRINGE
PREFILLED_SYRINGE | INTRAVENOUS | Status: AC
  Filled 2023-07-31: qty 10

## 2023-07-31 MED ORDER — LACTATED RINGERS IV SOLN: INTRAVENOUS | Status: DC

## 2023-07-31 MED ORDER — POVIDONE-IODINE 7.5 % EX SOLN
Freq: Once | CUTANEOUS | Status: DC
  Filled 2023-07-31: qty 118

## 2023-07-31 MED ORDER — ONDANSETRON HCL 4 MG/2ML IJ SOLN
INTRAMUSCULAR | Status: DC | PRN
  Administered 2023-07-31: 4 mg via INTRAVENOUS

## 2023-07-31 MED ORDER — EPHEDRINE SULFATE-NACL 50-0.9 MG/10ML-% IV SOSY
PREFILLED_SYRINGE | INTRAVENOUS | Status: DC | PRN
  Administered 2023-07-31: 5 mg via INTRAVENOUS
  Administered 2023-07-31: 10 mg via INTRAVENOUS
  Administered 2023-07-31: 5 mg via INTRAVENOUS

## 2023-07-31 MED ORDER — SUGAMMADEX SODIUM 200 MG/2ML IV SOLN
INTRAVENOUS | Status: DC | PRN
  Administered 2023-07-31: 175 mg via INTRAVENOUS

## 2023-07-31 MED ORDER — CHLORHEXIDINE GLUCONATE 0.12 % MT SOLN
15.0000 mL | Freq: Once | OROMUCOSAL | Status: AC
  Administered 2023-07-31: 15 mL via OROMUCOSAL
  Filled 2023-07-31: qty 15

## 2023-07-31 MED ORDER — MEPERIDINE HCL 25 MG/ML IJ SOLN: 6.2500 mg | INTRAMUSCULAR | Status: DC | PRN

## 2023-07-31 MED ORDER — PHENYLEPHRINE HCL-NACL 20-0.9 MG/250ML-% IV SOLN
INTRAVENOUS | Status: AC
  Filled 2023-07-31: qty 250

## 2023-07-31 SURGICAL SUPPLY — 63 items
BAG COUNTER SPONGE SURGICOUNT (BAG) ×1 IMPLANT
BENZOIN TINCTURE PRP APPL 2/3 (GAUZE/BANDAGES/DRESSINGS) ×1 IMPLANT
BIT DRILL NEURO 2X3.1 SFT TUCH (MISCELLANEOUS) ×1 IMPLANT
BLADE CLIPPER SURG (BLADE) ×1 IMPLANT
BLADE SURG 15 STRL LF DISP TIS (BLADE) ×1 IMPLANT
BONE VIVIGEN FORMABLE 1.3CC (Bone Implant) ×1 IMPLANT
CORD BIPOLAR FORCEPS 12FT (ELECTRODE) ×1 IMPLANT
COVER SURGICAL LIGHT HANDLE (MISCELLANEOUS) ×1 IMPLANT
DEVICE FUSION NANLCK 6 DEG 6 (Screw) IMPLANT
DRAIN JACKSON RD 7FR 3/32 (WOUND CARE) IMPLANT
DRAPE C-ARM 42X72 X-RAY (DRAPES) ×1 IMPLANT
DRAPE POUCH INSTRU U-SHP 10X18 (DRAPES) ×1 IMPLANT
DRAPE SURG 17X23 STRL (DRAPES) ×3 IMPLANT
DRILL NEURO 2X3.1 SOFT TOUCH (MISCELLANEOUS) ×1 IMPLANT
DURAPREP 26ML APPLICATOR (WOUND CARE) ×1 IMPLANT
ELECT COATED BLADE 2.86 ST (ELECTRODE) ×1 IMPLANT
ELECT REM PT RETURN 9FT ADLT (ELECTROSURGICAL) ×1 IMPLANT
ELECTRODE REM PT RTRN 9FT ADLT (ELECTROSURGICAL) ×1 IMPLANT
EVACUATOR SILICONE 100CC (DRAIN) IMPLANT
FUSION TCS NANOLOCK 6 DEG 6 (Screw) ×1 IMPLANT
GAUZE 4X4 16PLY ~~LOC~~+RFID DBL (SPONGE) ×1 IMPLANT
GAUZE SPONGE 4X4 12PLY STRL (GAUZE/BANDAGES/DRESSINGS) ×1 IMPLANT
GLOVE BIO SURGEON STRL SZ 6.5 (GLOVE) ×1 IMPLANT
GLOVE BIO SURGEON STRL SZ8 (GLOVE) ×1 IMPLANT
GLOVE BIOGEL PI IND STRL 7.0 (GLOVE) ×2 IMPLANT
GLOVE BIOGEL PI IND STRL 8 (GLOVE) ×1 IMPLANT
GLOVE SURG ENC MOIS LTX SZ6.5 (GLOVE) ×1 IMPLANT
GOWN STRL REUS W/ TWL LRG LVL3 (GOWN DISPOSABLE) ×1 IMPLANT
GOWN STRL REUS W/ TWL XL LVL3 (GOWN DISPOSABLE) ×1 IMPLANT
GRAFT BNE MATRIX VG FRMBL SM 1 (Bone Implant) IMPLANT
IV CATH 14GX2 1/4 (CATHETERS) ×1 IMPLANT
KIT BASIN OR (CUSTOM PROCEDURE TRAY) ×1 IMPLANT
KIT TURNOVER KIT B (KITS) ×1 IMPLANT
MANIFOLD NEPTUNE II (INSTRUMENTS) ×1 IMPLANT
NDL PRECISIONGLIDE 27X1.5 (NEEDLE) ×1 IMPLANT
NDL SPNL 20GX3.5 QUINCKE YW (NEEDLE) ×1 IMPLANT
NEEDLE PRECISIONGLIDE 27X1.5 (NEEDLE) ×1 IMPLANT
NEEDLE SPNL 20GX3.5 QUINCKE YW (NEEDLE) ×1 IMPLANT
NS IRRIG 1000ML POUR BTL (IV SOLUTION) ×1 IMPLANT
PACK ORTHO CERVICAL (CUSTOM PROCEDURE TRAY) ×1 IMPLANT
PAD ARMBOARD POSITIONER FOAM (MISCELLANEOUS) ×2 IMPLANT
PATTIES SURGICAL .5 X.5 (GAUZE/BANDAGES/DRESSINGS) IMPLANT
PATTIES SURGICAL .5 X1 (DISPOSABLE) IMPLANT
PLATE LOCK ENDO TCS (Plate) ×1 IMPLANT
PLATE LOCK ENDO TCS F/COVER (Plate) IMPLANT
POSITIONER HEAD DONUT 9IN (MISCELLANEOUS) ×1 IMPLANT
SCREW LOCKING 14MMX3.5MM (Screw) IMPLANT
SPONGE INTESTINAL PEANUT (DISPOSABLE) ×1 IMPLANT
SPONGE SURGIFOAM ABS GEL 100 (HEMOSTASIS) IMPLANT
STRIP CLOSURE SKIN 1/2X4 (GAUZE/BANDAGES/DRESSINGS) ×1 IMPLANT
SURGIFLO W/THROMBIN 8M KIT (HEMOSTASIS) IMPLANT
SUT MNCRL AB 4-0 PS2 18 (SUTURE) ×1 IMPLANT
SUT SILK 4-0 18XBRD TIE 12 (SUTURE) IMPLANT
SUT VIC AB 2-0 CT2 18 VCP726D (SUTURE) ×1 IMPLANT
SYR BULB IRRIG 60ML STRL (SYRINGE) ×1 IMPLANT
SYR CONTROL 10ML LL (SYRINGE) ×2 IMPLANT
TAPE CLOTH 4X10 WHT NS (GAUZE/BANDAGES/DRESSINGS) ×1 IMPLANT
TAPE CLOTH SURG 4X10 WHT LF (GAUZE/BANDAGES/DRESSINGS) IMPLANT
TAPE UMBILICAL 1/8X30 (MISCELLANEOUS) ×2 IMPLANT
TOWEL GREEN STERILE (TOWEL DISPOSABLE) ×1 IMPLANT
TOWEL GREEN STERILE FF (TOWEL DISPOSABLE) ×1 IMPLANT
WATER STERILE IRR 1000ML POUR (IV SOLUTION) ×1 IMPLANT
YANKAUER SUCT BULB TIP NO VENT (SUCTIONS) ×1 IMPLANT

## 2023-07-31 NOTE — Anesthesia Procedure Notes (Signed)
 Procedure Name: Intubation Date/Time: 07/31/2023 8:45 AM  Performed by: Hessie Diener, CRNAPre-anesthesia Checklist: Patient identified, Emergency Drugs available, Suction available and Patient being monitored Patient Re-evaluated:Patient Re-evaluated prior to induction Oxygen Delivery Method: Circle system utilized Preoxygenation: Pre-oxygenation with 100% oxygen Induction Type: IV induction Ventilation: Mask ventilation without difficulty Laryngoscope Size: Mac and 3 Grade View: Grade II Tube type: Oral Tube size: 7.5 mm Number of attempts: 1 Airway Equipment and Method: Stylet and Oral airway Placement Confirmation: ETT inserted through vocal cords under direct vision, positive ETCO2 and breath sounds checked- equal and bilateral Secured at: 21 cm Tube secured with: Tape Dental Injury: Teeth and Oropharynx as per pre-operative assessment

## 2023-07-31 NOTE — Transfer of Care (Signed)
 Immediate Anesthesia Transfer of Care Note  Patient: Marcus Brock  Procedure(s) Performed: ANTERIOR CERVICAL DECOMPRESSION/DISCECTOMY FUSION 1 LEVEL  Patient Location: PACU  Anesthesia Type:General  Level of Consciousness: drowsy  Airway & Oxygen Therapy: Patient connected to face mask oxygen  Post-op Assessment: Post -op Vital signs reviewed and stable  Post vital signs: stable  Last Vitals:  Vitals Value Taken Time  BP 161/93 07/31/23 1122  Temp    Pulse 86 07/31/23 1124  Resp 16 07/31/23 1124  SpO2 95 % 07/31/23 1124  Vitals shown include unfiled device data.  Last Pain:  Vitals:   07/31/23 0706  TempSrc:   PainSc: 6       Patients Stated Pain Goal: 4 (07/31/23 0705)  Complications: There were no known notable events for this encounter.

## 2023-07-31 NOTE — H&P (Signed)
 PREOPERATIVE H&P  Chief Complaint: Neck pain, bilateral arm pain and weakness  HPI: Marcus Brock is a 68 y.o. male who presents with ongoing pain in the neck and arms  MRI reveals spinal cord compression at C3/4  Patient has failed multiple forms of conservative care and continues to have pain (see office notes for additional details regarding the patient's full course of treatment)  Past Medical History:  Diagnosis Date   Allergy 1980   Anxiety 1982   Arthritis 1995   Asthma    COPD (chronic obstructive pulmonary disease) (HCC) 2024   Depression 1982   Diabetes mellitus without complication (HCC)    Diabetic neuropathy (HCC)    feet   GERD (gastroesophageal reflux disease) 1992   History of hiatal hernia    Hypertension    Neuromuscular disorder (HCC)    Neuropathy bilateral arms from cervical spine   Pneumonia    Ulcer 1988   Past Surgical History:  Procedure Laterality Date   ANKLE SURGERY Left    As a child. "Almost sliced in half"   APPENDECTOMY  01/2017   CHOLECYSTECTOMY  1995   COLON RESECTION N/A 04/2003   COLONOSCOPY  07/2002   12/2009; 12/2014   GANGLION CYST EXCISION Left 01/2002   left elbow   INGUINAL HERNIA REPAIR Right 2024   KNEE ARTHROSCOPY WITH MEDIAL MENISECTOMY Left 07/2017   NECK SURGERY  01/2014   NEVUS EXCISION     Back   ROTATOR CUFF REPAIR Right 12/2001   SPINE SURGERY  2018   Cervical Neck Fusion   TOOTH EXTRACTION     15 teeth removed due to infection   VASECTOMY  1996   Social History   Socioeconomic History   Marital status: Widowed    Spouse name: Not on file   Number of children: 2   Years of education: 74   Highest education level: Some college, no degree  Occupational History   Occupation: Retired.  Tobacco Use   Smoking status: Former    Current packs/day: 0.00    Average packs/day: 1.6 packs/day for 43.8 years (70.1 ttl pk-yrs)    Types: Cigarettes    Quit date: 04/23/2011    Years since quitting: 12.2    Smokeless tobacco: Never  Vaping Use   Vaping status: Never Used  Substance and Sexual Activity   Alcohol use: Never   Drug use: Never    Comment: Occassionally uses CBD   Sexual activity: Yes    Partners: Female    Birth control/protection: None  Other Topics Concern   Not on file  Social History Narrative   Lives with his eldest son. He has two sons. He enjoys watching sports.   Social Drivers of Corporate investment banker Strain: Low Risk  (05/01/2023)   Overall Financial Resource Strain (CARDIA)    Difficulty of Paying Living Expenses: Not very hard  Recent Concern: Financial Resource Strain - Medium Risk (02/25/2023)   Overall Financial Resource Strain (CARDIA)    Difficulty of Paying Living Expenses: Somewhat hard  Food Insecurity: Food Insecurity Present (05/01/2023)   Hunger Vital Sign    Worried About Running Out of Food in the Last Year: Sometimes true    Ran Out of Food in the Last Year: Sometimes true  Transportation Needs: No Transportation Needs (05/01/2023)   PRAPARE - Administrator, Civil Service (Medical): No    Lack of Transportation (Non-Medical): No  Physical Activity: Inactive (05/01/2023)  Exercise Vital Sign    Days of Exercise per Week: 0 days    Minutes of Exercise per Session: 0 min  Stress: Stress Concern Present (05/01/2023)   [employer] of Occupational Health - Occupational Stress Questionnaire    Feeling of Stress : To some extent  Social Connections: Socially Isolated (05/01/2023)   Social Connection and Isolation Panel [NHANES]    Frequency of Communication with Friends and Family: Once a week    Frequency of Social Gatherings with Friends and Family: Never    Attends Religious Services: Never    Database administrator or Organizations: No    Attends Banker Meetings: Never    Marital Status: Widowed   Family History  Problem Relation Age of Onset   Hypertension Other    Diabetes Other    Stroke Other    Colon  cancer Other    Skin cancer Other    Prostate cancer Other    COPD Mother    Depression Mother    Diabetes Mother    Heart disease Mother    Hypertension Mother    Cancer Father    COPD Father    Depression Father    Diabetes Father    Heart disease Father    Hypertension Father    Stroke Father    Cancer Maternal Grandfather    Early death Maternal Grandmother    Heart disease Maternal Grandmother    Early death Paternal Grandmother    Heart disease Paternal Grandmother    ADD / ADHD Son    Anxiety disorder Son    Birth defects Son    Depression Son    Learning disabilities Son    Early death Maternal Uncle    Heart disease Maternal Uncle    Early death Paternal Aunt    Allergies  Allergen Reactions   Penicillins Other (See Comments)    Received as newborn for ear infection and mother said "it was a bad reaction"   Wasp Venom Hives   Prior to Admission medications   Medication Sig Start Date End Date Taking? Authorizing Provider  acetaminophen-codeine (TYLENOL #2) 300-15 MG tablet Take 1 tablet by mouth every 4 (four) hours as needed for severe pain (pain score 7-10). 06/17/23  Yes [provider]  albuterol (VENTOLIN HFA) 108 (90 Base) MCG/ACT inhaler TAKE 2 PUFFS BY MOUTH EVERY 6 HOURS AS NEEDED FOR WHEEZE OR SHORTNESS OF BREATH 01/28/23  Yes Tamera Punt, Erika S, DO  ALPRAZolam (XANAX) 0.5 MG tablet Take 1 tablet (0.5 mg total) by mouth 2 (two) times daily as needed for anxiety. 05/28/23  Yes Charlton Amor, DO  aspirin EC 81 MG tablet Take 81 mg by mouth daily.   Yes [provider]  atorvastatin (LIPITOR) 40 MG tablet Take 1 tablet (40 mg total) by mouth at bedtime. 04/03/23  Yes Tamera Punt, Erika S, DO  Budeson-Glycopyrrol-Formoterol (BREZTRI AEROSPHERE) 160-9-4.8 MCG/ACT AERO Inhale 2 puffs into the lungs 2 (two) times daily. 04/03/23  Yes Tamera Punt, Erika S, DO  buPROPion (WELLBUTRIN XL) 150 MG 24 hr tablet Take 1 tablet (150 mg total) by mouth daily. 04/03/23  Yes  Charlton Amor, DO  Cholecalciferol (VITAMIN D3) 50 MCG (2000 UT) capsule Take 2,000 Units by mouth daily.   Yes [provider]  DULoxetine (CYMBALTA) 60 MG capsule Take 1 capsule (60 mg total) by mouth daily. 04/03/23  Yes Tamera Punt, Erika S, DO  EPINEPHrine 0.3 mg/0.3 mL IJ SOAJ injection Inject 0.3 mg into  the muscle as needed for anaphylaxis. 11/10/22  Yes [provider]  gabapentin (NEURONTIN) 600 MG tablet Take 1,200 mg by mouth 2 (two) times daily. 05/14/23  Yes Monica Becton, MD  losartan (COZAAR) 25 MG tablet TAKE 1 TABLET (25 MG TOTAL) BY MOUTH DAILY. 07/08/23  Yes Tamera Punt, Erika S, DO  meloxicam (MOBIC) 15 MG tablet Take 1 tablet (15 mg total) by mouth daily as needed for pain. Patient taking differently: Take 15 mg by mouth daily. 06/13/23  Yes Agapito Games, MD  metFORMIN (GLUCOPHAGE) 500 MG tablet Take 1 tablet (500 mg total) by mouth daily with breakfast. 04/03/23  Yes Tamera Punt, Erika S, DO  Multiple Vitamins-Minerals (MULTIVITAMIN GUMMIES MENS PO) Take 2 each by mouth daily.   Yes [provider]  tamsulosin (FLOMAX) 0.4 MG CAPS capsule Take 1 capsule (0.4 mg total) by mouth daily. 01/14/23  Yes Tamera Punt, Erika S, DO  tiZANidine (ZANAFLEX) 4 MG tablet Take 4 mg by mouth every 8 (eight) hours as needed for muscle spasms. 02/21/23  Yes [provider]  famotidine (PEPCID) 20 MG tablet Take 20 mg by mouth daily as needed for heartburn or indigestion.    [provider]  ipratropium-albuterol (DUONEB) 0.5-2.5 (3) MG/3ML SOLN Take 3 mLs by nebulization every 6 (six) hours as needed. 05/13/23   Charlton Amor, DO     All other systems have been reviewed and were otherwise negative with the exception of those mentioned in the HPI and as above.  Physical Exam: Vitals:   07/31/23 0648  BP: (!) 173/93  Pulse: 69  Resp: 17  Temp: 98 F (36.7 C)  SpO2: 97%    Body mass index is 26.92 kg/m.  General: Alert, no acute  distress Cardiovascular: No pedal edema Respiratory: No cyanosis, no use of accessory musculature Skin: No lesions in the area of chief complaint Neurologic: Sensation intact distally Psychiatric: Patient is competent for consent with normal mood and affect Lymphatic: No axillary or cervical lymphadenopathy   Assessment/Plan: CERVICAL MYEOLPATHY Plan for Procedure(s): ANTERIOR CERVICAL DECOMPRESSION/DISCECTOMY FUSION , C3/4   Jackelyn Hoehn, MD 07/31/2023 8:22 AM

## 2023-07-31 NOTE — Op Note (Signed)
 PATIENT NAME: Marcus Brock   MEDICAL RECORD NO.:   [id number]    DATE OF BIRTH: 1955/09/30   DATE OF PROCEDURE: 07/31/2023                               OPERATIVE REPORT     PREOPERATIVE DIAGNOSES: 1.  Progressive cervical myelopathy 2.  Spinal stenosis, including spinal cord compression, C3/4   POSTOPERATIVE DIAGNOSES: 1.  Progressive cervical myelopathy 2.  Spinal stenosis, including spinal cord compression, C3/4   PROCEDURE: 1. Anterior cervical decompression and fusion C3/4 2. Insertion of interbody device x1 (6mm Titan TCS intervertebral spacer) 3. Intraoperative use of fluoroscopy 4. Use of morselized allograft - ViviGen   SURGEON:  Estill Bamberg, MD   ASSISTANT:  Jason Coop, PA-C.   ANESTHESIA:  General endotracheal anesthesia.   COMPLICATIONS:  None.   DISPOSITION:  Stable.   ESTIMATED BLOOD LOSS:  Minimal.   INDICATIONS FOR SURGERY:  Briefly, [name] is a pleasant 68 -year- old male, who did present to me with severe pain in his neck and bilateral arms, in addition to numbness and weakness in his arms. The patient's MRI did reveal the findings noted above.  Given the Progressive symptoms and MRI findings, we did discuss proceeding with the procedure noted above.  The patient was fully aware of the risks and limitations of surgery as outlined in my preoperative note.   OPERATIVE DETAILS:  On 07/31/2023, the patient was brought to surgery and general endotracheal anesthesia was administered.  The patient was placed supine on the hospital bed. The neck was gently extended.  All bony prominences were meticulously padded.  The neck was prepped and draped in the usual sterile fashion.  At this point, I did make a left-sided transverse incision.  The platysma was incised.  A Smith-Robinson approach was used and the anterior spine was identified. A self-retaining retractor was placed.  The previously placed anterior instrumentation spanning C4-C6 was identified.  I then subperiosteally exposed the vertebral bodies from C3-C4. Caspar pins were then placed into the C3 and C4 vertebral bodies and distraction was applied.  A thorough and complete C3-4 intervertebral diskectomy was performed.  The posterior longitudinal ligament was identified and entered using a nerve hook.  I then used #1 followed by #2 Kerrison to perform a thorough and complete intervertebral diskectomy.  The spinal canal was thoroughly decompressed, as was the left and right neuroforamen.  The endplates were then prepared and the appropriate-sized intervertebral spacer was then packed with ViviGen and tamped into position in the usual fashion. The Caspar pins  then were removed and bone wax was placed in their place.  At this point, an angled awl was used to prepare the vertebral screws.  The awl was advanced through the implant into the C3 endplate, and inferiorly, through the C4 endplate.  14 mm screws were advanced through the implant, into the vertebral bodies, to the posterior cortex.  I was pleased with the press-fit of the screws.  The screws were then locked to the plate using the anterior screw locking mechanism. I was very pleased with the final fluoroscopic images.  The wound was then irrigated.  The wound was then explored for any undue bleeding and there was no bleeding noted. The wound was then closed in layers using 2-0 Vicryl, followed by 4-0 Monocryl.  Benzoin and Steri-Strips were applied, followed by sterile dressing.  All instrument counts were correct  at the termination of the procedure.   Of note, Jason Coop, PA-C, was my assistant throughout surgery, and did aid in retraction, placement of the hardware, suctioning, and closure from start to finish.   Estill Bamberg, MD

## 2023-07-31 NOTE — Anesthesia Postprocedure Evaluation (Signed)
 Anesthesia Post Note  Patient: Marcus Brock  Procedure(s) Performed: ANTERIOR CERVICAL DECOMPRESSION/DISCECTOMY FUSION 1 LEVEL     Patient location during evaluation: PACU Anesthesia Type: General Level of consciousness: awake and alert Pain management: pain level controlled Vital Signs Assessment: post-procedure vital signs reviewed and stable Respiratory status: spontaneous breathing, nonlabored ventilation, respiratory function stable and patient connected to nasal cannula oxygen Cardiovascular status: blood pressure returned to baseline and stable Postop Assessment: no apparent nausea or vomiting Anesthetic complications: no   There were no known notable events for this encounter.  Last Vitals:  Vitals:   07/31/23 1200 07/31/23 1215  BP: (!) 142/88 (!) 142/87  Pulse: 87 85  Resp: 15 15  Temp:  36.7 C  SpO2: 97% 96%    Last Pain:  Vitals:   07/31/23 1222  TempSrc:   PainSc: 4                  Estephani Popper

## 2023-08-01 ENCOUNTER — Encounter (HOSPITAL_COMMUNITY): Payer: Self-pay | Admitting: Orthopedic Surgery

## 2023-08-06 ENCOUNTER — Telehealth: Payer: Self-pay

## 2023-08-06 NOTE — Telephone Encounter (Signed)
 RECEIVED A REQUEST FROM [employer] FOR HEALTH HISTORY. I Submitted VIA FAX HEALTH HISTORY  for BREZTRI to [employer]([facility])for patient assistance TO PHARMACY SERVICES   Phone: [phone] FAX: [phone]

## 2023-08-22 ENCOUNTER — Ambulatory Visit (INDEPENDENT_AMBULATORY_CARE_PROVIDER_SITE_OTHER): Admitting: Sports Medicine

## 2023-08-22 DIAGNOSIS — M19042 Primary osteoarthritis, left hand: Secondary | ICD-10-CM

## 2023-08-22 DIAGNOSIS — M47816 Spondylosis without myelopathy or radiculopathy, lumbar region: Secondary | ICD-10-CM | POA: Diagnosis not present

## 2023-08-22 DIAGNOSIS — M19041 Primary osteoarthritis, right hand: Secondary | ICD-10-CM | POA: Diagnosis not present

## 2023-08-22 DIAGNOSIS — Q761 Klippel-Feil syndrome: Secondary | ICD-10-CM | POA: Diagnosis not present

## 2023-08-22 MED ORDER — ACETAMINOPHEN ER 650 MG PO TBCR: 650.0000 mg | EXTENDED_RELEASE_TABLET | Freq: Three times a day (TID) | ORAL | Status: AC | PRN

## 2023-08-22 MED ORDER — MELOXICAM 15 MG PO TABS: ORAL_TABLET | ORAL | 3 refills | Status: AC

## 2023-08-22 NOTE — Assessment & Plan Note (Signed)
 Bilateral hand osteoarthritis, he has had multiple modalities including steroids, NSAIDs. His NSAID was stopped for his ACDF, I do think we should go ahead and restart it, he will continue arthritis from Tylenol , avoiding any steroid injections due to the potential for delaying healing. Hopefully he can do some hand therapy as well during his PT sessions. Continue hand arthritis compressive gloves. Continue topical Voltaren, icing.

## 2023-08-22 NOTE — Progress Notes (Signed)
    Procedures performed today:    None.  Independent interpretation of notes and tests performed by another provider:   None.  Brief History, Exam, Impression, and Recommendations:    Lumbar spondylosis Multifactorial lumbar spondylosis, he has had epidurals in the past that provided very good relief, currently on gabapentin  1200 mg twice a day, he had excessive sedation at 3 times daily. He understands the importance of physical therapy, we will start this now, after about 6 weeks or so if not better we can do an MRI, his ACDF should be healed sufficiently at that point for us  to consider an epidural.  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 His NSAID was stopped for his ACDF, I do think we should go ahead and restart it, he will continue arthritis from Tylenol , avoiding any steroid injections due to the potential for delaying healing. Hopefully he can do some hand therapy as well during his PT sessions. Continue hand arthritis compressive gloves. Continue topical Voltaren, icing.   Cervical fusion syndrome [name] does have a history of C5-C7 fusion with persistent axial neck pain, he had bilateral radicular symptoms not better with gabapentin , meloxicam , tizanidine , Cymbalta . We added some steroids, we bumped up his gabapentin  at the last visit, ultimately gabapentin  at 3600 mg was too high so he dropped back down to 2400 total. He did endorse a positive Lhermette sign so we obtained a cervical spine MRI, the cervical spine MRI did end up showing severe cervical spinal stenosis C3-C4 with T2 edema in the spinal cord consistent with myelomalacia and spinal cord compression syndrome, she got a urgent spinal surgery referral to Dr. Jackee Marus, he ended up with a cervical ACDF, currently recovering.    ____________________________________________ Joselyn Nicely. Sandy Crumb, M.D., ABFM., CAQSM.,  AME. Primary Care and Sports Medicine [facility]  Adjunct Professor of [facility]  [employer]  Restaurant manager, fast food

## 2023-08-22 NOTE — Assessment & Plan Note (Signed)
 Marcus Brock does have a history of C5-C7 fusion with persistent axial neck pain, he had bilateral radicular symptoms not better with gabapentin , meloxicam , tizanidine , Cymbalta . We added some steroids, we bumped up his gabapentin  at the last visit, ultimately gabapentin  at 3600 mg was too high so he dropped back down to 2400 total. He did endorse a positive Lhermette sign so we obtained a cervical spine MRI, the cervical spine MRI did end up showing severe cervical spinal stenosis C3-C4 with T2 edema in the spinal cord consistent with myelomalacia and spinal cord compression syndrome, she got a urgent spinal surgery referral to Dr. Jackee Marus, he ended up with a cervical ACDF, currently recovering.

## 2023-08-22 NOTE — Assessment & Plan Note (Signed)
 Multifactorial lumbar spondylosis, he has had epidurals in the past that provided very good relief, currently on gabapentin  1200 mg twice a day, he had excessive sedation at 3 times daily. He understands the importance of physical therapy, we will start this now, after about 6 weeks or so if not better we can do an MRI, his ACDF should be healed sufficiently at that point for us  to consider an epidural.

## 2023-08-27 ENCOUNTER — Encounter: Payer: Self-pay | Admitting: Sports Medicine

## 2023-08-27 NOTE — Therapy (Unsigned)
 OUTPATIENT PHYSICAL THERAPY THORACOLUMBAR EVALUATION   Patient Name: Stancil Coelho MRN: [id number] DOB:Oct 17, 1955, 68 y.o., male Today's Date: 08/28/2023  END OF SESSION:  PT End of Session - 08/28/23 1320     Visit Number 1    Number of Visits 16    Date for PT Re-Evaluation 10/23/23    Authorization Type [employer] medicare $10 copay    Progress Note Due on Visit 10    PT Start Time 1145    PT Stop Time 1233    PT Time Calculation (min) 48 min    Activity Tolerance Patient tolerated treatment well             Past Medical History:  Diagnosis Date   Allergy 1980   Anxiety 1982   Arthritis 1995   Asthma    COPD (chronic obstructive pulmonary disease) (HCC) 2024   Depression 1982   Diabetes mellitus without complication (HCC)    Diabetic neuropathy (HCC)    feet   GERD (gastroesophageal reflux disease) 1992   History of hiatal hernia    Hypertension    Neuromuscular disorder (HCC)    Neuropathy bilateral arms from cervical spine   Pneumonia    Ulcer 1988   Past Surgical History:  Procedure Laterality Date   ANKLE SURGERY Left    As a child. "Almost sliced in half"   ANTERIOR CERVICAL DECOMP/DISCECTOMY FUSION N/A 07/31/2023   Procedure: ANTERIOR CERVICAL DECOMPRESSION/DISCECTOMY FUSION 1 LEVEL;  Surgeon: Virl Grimes, MD;  Location: [facility] OR;  Service: Orthopedics;  Laterality: N/A;  ANTERIOR CERVICAL DECOMPRESSION FUSION CERVICAL 3- CERVICAL 4 WITH INSTRUMENTATION AND ALLOGRAFT   APPENDECTOMY  01/2017   CHOLECYSTECTOMY  1995   COLON RESECTION N/A 04/2003   COLONOSCOPY  07/2002   12/2009; 12/2014   GANGLION CYST EXCISION Left 01/2002   left elbow   INGUINAL HERNIA REPAIR Right 2024   KNEE ARTHROSCOPY WITH MEDIAL MENISECTOMY Left 07/2017   NECK SURGERY  01/2014   NEVUS EXCISION     Back   ROTATOR CUFF REPAIR Right 12/2001   SPINE SURGERY  2018   Cervical Neck Fusion   TOOTH EXTRACTION     15 teeth removed due to infection   VASECTOMY  1996   Patient Active  Problem List   Diagnosis Date Noted   Panic attack 05/28/2023   COPD exacerbation (HCC) 05/13/2023   Acute cough 05/13/2023   Primary osteoarthritis of both hands 05/02/2023   Primary osteoarthritis of both knees 05/02/2023   Polyarthralgia 04/03/2023   Cervical fusion syndrome 01/14/2023   Peripheral polyneuropathy 01/14/2023   Weak urinary stream 01/14/2023   Chronic obstructive pulmonary disease (HCC) 01/02/2023   Type 2 diabetes mellitus without complication, without long-term current use of insulin  (HCC) 12/05/2022   Lumbar spondylosis 12/05/2022   History of CAD (coronary artery disease) 12/05/2022   Mixed hyperlipidemia 12/05/2022   GAD (generalized anxiety disorder) 12/05/2022   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 episode (HCC) 12/05/2022   Gastroesophageal reflux disease without esophagitis 12/05/2022   Wheezing 12/05/2022   Bilateral inguinal hernia without obstruction or gangrene 05/15/2022   Anxiety 04/25/2022   S/P left knee arthroscopy 07/26/2017   Acute medial meniscus tear of left knee 07/03/2017   Perforated appendicitis 01/29/2017   Anterior basement membrane dystrophy 02/27/2016   Hematuria, microscopic 09/16/2012    PCP: Dr Marliss Simple Reina Cara,  REFERRING PROVIDER: Dr Annemarie Kil  REFERRING DIAG: Lumbar spondylosis  Rationale for Evaluation and Treatment: Rehabilitation  THERAPY DIAG:  Lumbar spondylosis - Plan: PT plan of care cert/re-cert  Other symptoms and signs involving the musculoskeletal system - Plan: PT plan of care cert/re-cert  Muscle weakness (generalized) - Plan: PT plan of care cert/re-cert  Unsteadiness - Plan: PT plan of care cert/re-cert  ONSET DATE: 08/22/23 flare up of long standing back pain  SUBJECTIVE:                                                                                                                                                                                            SUBJECTIVE STATEMENT: Patient reports that he has has sciatica for 35 years. He had a flare up of R LB and R LE about a week ago. Symptoms have resolved today. He has some good days and bad days. He does not know what causes the difference in how he feels. He has had epidural steroid shots for years. He has pain in both sides at different times. He has sensation of weakness and stiffness. Has been sedentary for the past few months because of neck pain.   PERTINENT HISTORY:  ACDF 07/31/23; first cervical fusion 09-24-16; arthritis; AODM; HTN; anxiety; has had 14 surgeries - neck, shoulder, knee, ankle, elbow surgeries, appendectomy, gallbladder surgery,   PAIN:  Are you having pain? Yes: NPRS scale: 3/10 mostly in neck today  Pain location: R LB Pain description: sharp - posterior hip, thigh, calf to ankle  Aggravating factors: unknown  Relieving factors: heating pad; OTC meds; muscle relaxer   PRECAUTIONS: Other: ACDF 07/29/23 - limited to gentle cervical movement; no lifting  RED FLAGS: None   WEIGHT BEARING RESTRICTIONS: No  FALLS:  Has patient fallen in last 6 months? Yes. 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  DIAGNOSTIC FINDINGS:  MRi lumbar spine 03/11/23; 1. Progressive multilevel  lumbar spondylosis as described above. 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 2. Worsened now moderate bilateral lateral recess stenosis and mild-to-moderate spinal canal stenosis at L4-L5.  PATIENT SURVEYS:  Modified Oswestry 30/50; 60% limitation    COGNITION: Overall cognitive status: Within functional limits for tasks assessed     SENSATION: Peripherial neuropathy bilat LE's x 10 yrs   MUSCLE LENGTH: Hamstrings: Right 55 deg; Left 65 deg   POSTURE: rounded shoulders, forward head, decreased lumbar lordosis, increased thoracic kyphosis, weight shift left, and L LE shorter that R in standing   PALPATION: Tightness bilat hip flexors - hx of bilat hernia repair and bowel resection  LUMBAR ROM:   AROM eval  Flexion 60% pull B back and thighs  Extension 30%   Right lateral flexion 70%   Left lateral flexion 55% tight R LB  Right rotation 50%  Left rotation 50%    (Blank rows = not tested)  LOWER EXTREMITY ROM:  end range tightness R > L hips - assessed in supine due to ACDF   Active  Right eval Left eval  Hip flexion    Hip extension    Hip abduction    Hip adduction    Hip internal rotation    Hip external rotation    Knee flexion    Knee extension    Ankle dorsiflexion    Ankle plantarflexion    Ankle inversion    Ankle eversion     (Blank rows = not tested)  LOWER EXTREMITY MMT:  assessed in supine due to ACDF   MMT Right eval Left eval  Hip flexion 4- 4  Hip extension 4- 4-  Hip abduction 4- 4-  Hip adduction    Hip internal rotation    Hip external rotation    Knee flexion    Knee extension    Ankle dorsiflexion    Ankle plantarflexion    Ankle inversion    Ankle eversion     (Blank rows = not tested)  LUMBAR SPECIAL TESTS:  Straight leg raise test: Negative  and Slump test: Negative  FUNCTIONAL TESTS:  5 times sit to stand: 20.5 sec  SLS - unable to balance on either leg   GAIT: Distance walked: 40 feet Assistive device utilized: None Level of assistance: Complete Independence Comments: fwd flexed trunk; LE's in ER  TREATMENT DATE:  Review of eval findings Exercises as noted below                                                                                                                                  PATIENT EDUCATION:  Education details: POC; HEP  Person educated: Patient Education method: Programmer, multimedia, Demonstration, Actor cues, Verbal cues, and Handouts Education comprehension: verbalized understanding, returned demonstration, verbal cues required, tactile cues required, and needs further education  HOME EXERCISE PROGRAM: Access Code: [email or web address] URL: [email or web address] Date: 08/28/2023 Prepared by:  Keaisha Sublette  Exercises - Development worker, community with Strap  - 2 x daily - 7 x weekly - 1 sets - 3 reps - 30 sec  hold - Hooklying Single Knee to Chest Stretch  - 2 x daily - 7 x weekly - 1 sets - 3-5 reps - 10-15 sec  hold - Bent Knee Fallouts  - 2 x daily - 7 x weekly - 1-2 sets - 10 reps - 2-3 sec  hold  ASSESSMENT:  CLINICAL IMPRESSION: Patient is a 68 y.o. male who was seen today for physical therapy evaluation and treatment for low back pain with diagnosis of lumbar spondylosis. He had a ACDF for cervical cord compression with decreased strength due to compression 07/31/23 and remains on restrictions including gentle cervical ROM and no lifting. 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 He has been treated with chiropractic care and ESI injections. He presents with poor posture and alignment; limited trunk and LE mobility and ROM; decreased strength core and LE's; peripheral neuropathy bilat LE's; poor balance and history of frequent falls. Patient has  intermittent LE pain in either LE. He reports that he has a sedentary lifestyle and fatigues quickly with physical activities. He will benefit from PT to address areas of deficits identified.   OBJECTIVE IMPAIRMENTS: Abnormal gait, decreased activity tolerance, decreased balance, decreased mobility, decreased ROM, decreased strength, impaired flexibility, improper body mechanics, postural dysfunction, and pain.   ACTIVITY LIMITATIONS: carrying, lifting, bending, sitting, standing, squatting, sleeping, and stairs  PARTICIPATION LIMITATIONS: meal prep, cleaning, laundry, shopping, community activity, and yard work  PERSONAL FACTORS: Age, Behavior pattern, Fitness, Past/current experiences, Time since onset of injury/illness/exacerbation, and comorbidities: AODM; peripheral neuropathy, sedentary lifestyle, weakness, decreased endurance are also affecting patient's functional outcome.   REHAB POTENTIAL: Good  CLINICAL DECISION MAKING: Evolving/moderate complexity  EVALUATION COMPLEXITY: Moderate   GOALS: Goals reviewed with patient? Yes  SHORT TERM GOALS: Target date: 09/25/2023   Independent in initial HEP  Baseline: Goal status: INITIAL  2.  Increase activity tolerance with patient tolerating 20 min of exercise without fatigue reported  Baseline:  Goal status: INITIAL  3.  Patient reports walking for grocery shopping without difficulty  Baseline:  Goal status: INITIAL   LONG TERM GOALS: Target date: 10/23/2023   Increase LE strength to 4+/5 to 5/5  Baseline:  Goal status: INITIAL  2.  Improve functional strength with patient demonstrating decrease in timed sit to stand by 3-5 sec  Baseline:  Goal status: INITIAL  3.  Improve activity tolerance with patient to tolerate 30-45 minutes of therapeutic activity and exercise with only 1-2 periods of 2-3 min rest to recovery  Baseline:  Goal status: INITIAL  4.  Independent in HEP including aquatic therapy as indicated   Baseline:  Goal status: INITIAL  5.  Improve Modified Owestry score by 10-15 points  Baseline: 30/50; 60%  Goal status: INITIAL   PLAN:  PT FREQUENCY: 2x/week  PT DURATION: 8 weeks  PLANNED INTERVENTIONS: 97164- PT Re-evaluation, 97110-Therapeutic exercises, 97530- Therapeutic activity, 97112- Neuromuscular re-education, 97535- Self Care, 47829- Manual therapy, [phone]- Gait training, [phone]- Aquatic Therapy, Patient/Family education, Balance training, Stair training, Taping, Dry Needling, and Joint mobilization.  PLAN FOR NEXT SESSION: review and progress exercises; continue spine care education; manual work and modalities as indicated    Quamere Mussell P Veldon Wager, PT 08/28/2023, 1:22 PM

## 2023-08-28 ENCOUNTER — Ambulatory Visit: Attending: Sports Medicine | Admitting: Rehabilitative and Restorative Service Providers"

## 2023-08-28 ENCOUNTER — Encounter: Payer: Self-pay | Admitting: Rehabilitative and Restorative Service Providers"

## 2023-08-28 ENCOUNTER — Other Ambulatory Visit: Payer: Self-pay

## 2023-08-28 DIAGNOSIS — R2681 Unsteadiness on feet: Secondary | ICD-10-CM | POA: Insufficient documentation

## 2023-08-28 DIAGNOSIS — M47816 Spondylosis without myelopathy or radiculopathy, lumbar region: Secondary | ICD-10-CM | POA: Insufficient documentation

## 2023-08-28 DIAGNOSIS — R29898 Other symptoms and signs involving the musculoskeletal system: Secondary | ICD-10-CM | POA: Insufficient documentation

## 2023-08-28 DIAGNOSIS — M6281 Muscle weakness (generalized): Secondary | ICD-10-CM | POA: Insufficient documentation

## 2023-08-29 ENCOUNTER — Encounter: Payer: Self-pay | Admitting: Family Medicine

## 2023-09-04 ENCOUNTER — Encounter: Payer: Self-pay | Admitting: Physician Assistant

## 2023-09-04 ENCOUNTER — Ambulatory Visit (INDEPENDENT_AMBULATORY_CARE_PROVIDER_SITE_OTHER): Admitting: Physician Assistant

## 2023-09-04 VITALS — BP 143/86 | HR 72 | Ht 71.0 in | Wt 189.0 lb

## 2023-09-04 DIAGNOSIS — M47816 Spondylosis without myelopathy or radiculopathy, lumbar region: Secondary | ICD-10-CM

## 2023-09-04 DIAGNOSIS — M4322 Fusion of spine, cervical region: Secondary | ICD-10-CM | POA: Diagnosis not present

## 2023-09-04 DIAGNOSIS — I10 Essential (primary) hypertension: Secondary | ICD-10-CM

## 2023-09-04 DIAGNOSIS — E1140 Type 2 diabetes mellitus with diabetic neuropathy, unspecified: Secondary | ICD-10-CM

## 2023-09-04 DIAGNOSIS — M5412 Radiculopathy, cervical region: Secondary | ICD-10-CM | POA: Diagnosis not present

## 2023-09-04 DIAGNOSIS — Q761 Klippel-Feil syndrome: Secondary | ICD-10-CM

## 2023-09-04 LAB — POCT UA - MICROALBUMIN
Albumin/Creatinine Ratio, Urine, POC: <30
Creatinine, POC: 300 mg/dL
Microalbumin Ur, POC: 30 mg/L

## 2023-09-04 MED ORDER — DULOXETINE HCL 60 MG PO CPEP: 60.0000 mg | ORAL_CAPSULE | Freq: Two times a day (BID) | ORAL | 1 refills | Status: DC

## 2023-09-04 NOTE — Progress Notes (Unsigned)
 Established Patient Office Visit  Subjective   Patient ID: Marcus Brock, male    DOB: [birth year]  Age: 68 y.o. MRN: [id number]  Chief Complaint  Patient presents with   Transitions Of Care    HPI Pt is a 68 yo male with T2DM, HTN, cervical and lumbar DDD who present to the clinic to follow up after anterior cervical fusion surgery on 07/31/2023. He is still in neck brace. His last cervical fusion was 2018. Dr. Jackee Marus performed last surgery.   Pt is taking metformin  for T2DM, he does not check his sugars regular. He denies any hypoglycemic events. He is currently not active due to pain. He is is PT for low back pain. He takes tizandidine, tylenol , cymbalta , and gabapentin  for pain.   He is worried about EKG findings before surgery that showed "he might have had a heart attack before". He denies any CP, palpitations, SOB, or chest tightness. He is on lipitor daily.   Pt does not smoke. He has quit is 2012.   Active Ambulatory Problems    Diagnosis Date Noted   Hematuria, microscopic 09/16/2012   Acute medial meniscus tear of left knee 07/03/2017   Anterior basement membrane dystrophy 02/27/2016   Anxiety 04/25/2022   Bilateral inguinal hernia without obstruction or gangrene 05/15/2022   Perforated appendicitis 01/29/2017   S/P left knee arthroscopy 07/26/2017   Type 2 diabetes mellitus without complication, without long-term current use of insulin  (HCC) 12/05/2022   Lumbar spondylosis 12/05/2022   History of CAD (coronary artery disease) 12/05/2022   Mixed hyperlipidemia 12/05/2022   GAD (generalized anxiety disorder) 12/05/2022   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 episode (HCC) 12/05/2022   Gastroesophageal reflux disease without esophagitis 12/05/2022   Wheezing 12/05/2022   Chronic obstructive pulmonary disease (HCC) 01/02/2023   Cervical fusion syndrome 01/14/2023   Peripheral polyneuropathy 01/14/2023   Weak urinary stream 01/14/2023    Polyarthralgia 04/03/2023   Primary osteoarthritis of both hands 05/02/2023   Primary osteoarthritis of both knees 05/02/2023   COPD exacerbation (HCC) 05/13/2023   Acute cough 05/13/2023   Panic attack 05/28/2023   HTN, goal below 130/80 09/06/2023   Type 2 diabetes mellitus with diabetic neuropathy, without long-term current use of insulin  (HCC) 09/06/2023   Resolved Ambulatory Problems    Diagnosis Date Noted   Cervical radiculopathy 04/03/2023   Left hand pain 05/13/2023   Past Medical History:  Diagnosis Date   Allergy 1980   Arthritis 1995   Asthma    COPD (chronic obstructive pulmonary disease) (HCC) 2024   Depression 1982   Diabetes mellitus without complication (HCC)    Diabetic neuropathy (HCC)    GERD (gastroesophageal reflux disease) 1992   History of hiatal hernia    Hypertension    Neuromuscular disorder (HCC)    Pneumonia    Ulcer 1988          ROS    Objective:     BP (!) 143/86   Pulse 72   Ht 5\' 11"  (1.803 m)   Wt 189 lb (85.7 kg)   SpO2 99%   BMI 26.36 kg/m  BP Readings from Last 3 Encounters:  09/04/23 (!) 143/86  07/31/23 (!) 142/87  07/22/23 (!) 157/100   Wt Readings from Last 3 Encounters:  09/04/23 189 lb (85.7 kg)  07/31/23 193 lb (87.5 kg)  07/22/23 190 lb 9.6 oz (86.5 kg)     .[facility] Lab Results  Component Value Date   HGBA1C 5.7 (  H) 07/22/2023    Physical Exam Constitutional:      Appearance: Normal appearance.  HENT:     Head: Normocephalic.  Neck:     Comments: Neck brace in place.  Cardiovascular:     Rate and Rhythm: Normal rate and regular rhythm.  Pulmonary:     Effort: Pulmonary effort is normal.     Breath sounds: Normal breath sounds.  Musculoskeletal:     Right lower leg: No edema.     Left lower leg: No edema.  Neurological:     General: No focal deficit present.     Mental Status: He is alert and oriented to person, place, and time.  Psychiatric:        Mood and Affect: Mood normal.       Results for orders placed or performed in visit on 09/04/23  POCT UA - Microalbumin  Result Value Ref Range   Microalbumin Ur, POC 30 mg/L   Creatinine, POC 300 mg/dL   Albumin/Creatinine Ratio, Urine, POC <30   .[facility] Aas Lab Results  Component Value Date   HGBA1C 5.7 (H) 07/22/2023     The 10-year ASCVD risk score (Arnett DK, et al., 2019) is: 31.6%    Assessment & Plan:  Aaron AasAaron AasLawrence "[name]" was seen today for transitions of care.  Diagnoses and all orders for this visit:  Type 2 diabetes mellitus with diabetic neuropathy, without long-term current use of insulin  (HCC) -     POCT UA - Microalbumin  Cervical vertebral fusion -     DULoxetine  (CYMBALTA ) 60 MG capsule; Take 1 capsule (60 mg total) by mouth 2 (two) times daily.  Cervical radiculopathy -     DULoxetine  (CYMBALTA ) 60 MG capsule; Take 1 capsule (60 mg total) by mouth 2 (two) times daily.  Lumbar spondylosis -     DULoxetine  (CYMBALTA ) 60 MG capsule; Take 1 capsule (60 mg total) by mouth 2 (two) times daily.  Cervical fusion syndrome -     DULoxetine  (CYMBALTA ) 60 MG capsule; Take 1 capsule (60 mg total) by mouth 2 (two) times daily.  HTN, goal below 130/80   A1C to goal Stay on metformin  BP not to goal but patient is in some discomfort today Increased cymbalta  to 60mg  bid Recheck BP in 2-4 weeks nurse visit Normal microalbumin On statin, last LDL 88.  Reassurance given on EKG findings after review. If symptomatic we could get stress test. Per patient has had before and normal.  Pt is not smoking which is great.  Needs eye exam.  Follow up in 3 months.    Return in about 3 months (around 12/05/2023).    Raychell Holcomb, PA-C

## 2023-09-06 ENCOUNTER — Encounter: Payer: Self-pay | Admitting: Physician Assistant

## 2023-09-06 DIAGNOSIS — I10 Essential (primary) hypertension: Secondary | ICD-10-CM | POA: Insufficient documentation

## 2023-09-06 DIAGNOSIS — E1140 Type 2 diabetes mellitus with diabetic neuropathy, unspecified: Secondary | ICD-10-CM | POA: Insufficient documentation

## 2023-09-09 DIAGNOSIS — G9520 Unspecified cord compression: Secondary | ICD-10-CM | POA: Diagnosis not present

## 2023-09-18 ENCOUNTER — Ambulatory Visit

## 2023-09-18 DIAGNOSIS — M6281 Muscle weakness (generalized): Secondary | ICD-10-CM | POA: Diagnosis not present

## 2023-09-18 DIAGNOSIS — M47816 Spondylosis without myelopathy or radiculopathy, lumbar region: Secondary | ICD-10-CM

## 2023-09-18 DIAGNOSIS — R2681 Unsteadiness on feet: Secondary | ICD-10-CM | POA: Diagnosis not present

## 2023-09-18 DIAGNOSIS — R29898 Other symptoms and signs involving the musculoskeletal system: Secondary | ICD-10-CM

## 2023-09-18 NOTE — Therapy (Signed)
 OUTPATIENT PHYSICAL THERAPY THORACOLUMBAR TREATMENT   Patient Name: Marcus Brock MRN: [id number] DOB:06-25-1955, 68 y.o., male Today's Date: 09/18/2023  END OF SESSION:  PT End of Session - 09/18/23 1534     Visit Number 2    Number of Visits 16    Date for PT Re-Evaluation 10/23/23    Authorization Type [employer] medicare $10 copay    Progress Note Due on Visit 10    PT Start Time 1534    PT Stop Time 1620    PT Time Calculation (min) 46 min    Activity Tolerance Patient tolerated treatment well    Behavior During Therapy WFL for tasks assessed/performed             Past Medical History:  Diagnosis Date   Allergy 1980   Anxiety 1982   Arthritis 1995   Asthma    COPD (chronic obstructive pulmonary disease) (HCC) 2024   Depression 1982   Diabetes mellitus without complication (HCC)    Diabetic neuropathy (HCC)    feet   GERD (gastroesophageal reflux disease) 1992   History of hiatal hernia    Hypertension    Neuromuscular disorder (HCC)    Neuropathy bilateral arms from cervical spine   Pneumonia    Ulcer 1988   Past Surgical History:  Procedure Laterality Date   ANKLE SURGERY Left    As a child. "Almost sliced in half"   ANTERIOR CERVICAL DECOMP/DISCECTOMY FUSION N/A 07/31/2023   Procedure: ANTERIOR CERVICAL DECOMPRESSION/DISCECTOMY FUSION 1 LEVEL;  Surgeon: Virl Grimes, MD;  Location: [facility] OR;  Service: Orthopedics;  Laterality: N/A;  ANTERIOR CERVICAL DECOMPRESSION FUSION CERVICAL 3- CERVICAL 4 WITH INSTRUMENTATION AND ALLOGRAFT   APPENDECTOMY  01/2017   CHOLECYSTECTOMY  1995   COLON RESECTION N/A 04/2003   COLONOSCOPY  07/2002   12/2009; 12/2014   GANGLION CYST EXCISION Left 01/2002   left elbow   INGUINAL HERNIA REPAIR Right 2024   KNEE ARTHROSCOPY WITH MEDIAL MENISECTOMY Left 07/2017   NECK SURGERY  01/2014   NEVUS EXCISION     Back   ROTATOR CUFF REPAIR Right 12/2001   SPINE SURGERY  2018   Cervical Neck Fusion   TOOTH EXTRACTION     15 teeth  removed due to infection   VASECTOMY  1996   Patient Active Problem List   Diagnosis Date Noted   HTN, goal below 130/80 09/06/2023   Type 2 diabetes mellitus with diabetic neuropathy, without long-term current use of insulin  (HCC) 09/06/2023   Panic attack 05/28/2023   COPD exacerbation (HCC) 05/13/2023   Acute cough 05/13/2023   Primary osteoarthritis of both hands 05/02/2023   Primary osteoarthritis of both knees 05/02/2023   Polyarthralgia 04/03/2023   Cervical fusion syndrome 01/14/2023   Peripheral polyneuropathy 01/14/2023   Weak urinary stream 01/14/2023   Chronic obstructive pulmonary disease (HCC) 01/02/2023   Type 2 diabetes mellitus without complication, without long-term current use of insulin  (HCC) 12/05/2022   Lumbar spondylosis 12/05/2022   History of CAD (coronary artery disease) 12/05/2022   Mixed hyperlipidemia 12/05/2022   GAD (generalized anxiety disorder) 12/05/2022   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 episode (HCC) 12/05/2022   Gastroesophageal reflux disease without esophagitis 12/05/2022   Wheezing 12/05/2022   Bilateral inguinal hernia without obstruction or gangrene 05/15/2022   Anxiety 04/25/2022   S/P left knee arthroscopy 07/26/2017   Acute medial meniscus tear of left knee 07/03/2017   Perforated appendicitis 01/29/2017   Anterior basement membrane dystrophy 02/27/2016  Hematuria, microscopic 09/16/2012    PCP: Dr Josepha Nickels,  REFERRING PROVIDER: Dr Annemarie Kil  REFERRING DIAG: Lumbar spondylosis  Rationale for Evaluation and Treatment: Rehabilitation  THERAPY DIAG:  Other symptoms and signs involving the musculoskeletal system  Muscle weakness (generalized)  Lumbar spondylosis  Unsteadiness  ONSET DATE: 08/22/23 flare up of long standing back pain  SUBJECTIVE:                                                                                                                                                                                            SUBJECTIVE STATEMENT: Patient reports he is not having any issues with sciatica pain today, however his balance feels unsteady "due to neck".  EVAL: Patient reports that he has has sciatica for 35 years. He had a flare up of R LB and R LE about a week ago. Symptoms have resolved today. He has some good days and bad days. He does not know what causes the difference in how he feels. He has had epidural steroid shots for years. He has pain in both sides at different times. He has sensation of weakness and stiffness. Has been sedentary for the past few months because of neck pain.   PERTINENT HISTORY:  ACDF 07/31/23; first cervical fusion 10-07-2016; arthritis; AODM; HTN; anxiety; has had 14 surgeries - neck, shoulder, knee, ankle, elbow surgeries, appendectomy, gallbladder surgery,   PAIN:  Are you having pain? Yes: NPRS scale: 3/10 mostly in neck today  Pain location: R LB Pain description: sharp - posterior hip, thigh, calf to ankle  Aggravating factors: unknown  Relieving factors: heating pad; OTC meds; muscle relaxer   PRECAUTIONS: Other: ACDF 07/29/23 - limited to gentle cervical movement; no lifting  RED FLAGS: None   WEIGHT BEARING RESTRICTIONS: No  FALLS:  Has patient fallen in last 6 months? Yes. 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  DIAGNOSTIC FINDINGS:  MRi lumbar spine 03/11/23; 1. Progressive multilevel lumbar spondylosis as described above. 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 2. Worsened now moderate bilateral lateral recess stenosis and mild-to-moderate spinal canal stenosis at L4-L5.  PATIENT SURVEYS:  Modified Oswestry 30/50; 60% limitation    COGNITION: Overall cognitive status: Within functional limits for tasks assessed     SENSATION: Peripherial neuropathy bilat LE's x 10 yrs   MUSCLE LENGTH: Hamstrings: Right 55 deg; Left 65 deg   POSTURE: rounded shoulders, forward head, decreased lumbar lordosis, increased thoracic kyphosis, weight shift left, and L LE shorter that R in standing   PALPATION: Tightness bilat hip flexors - hx of bilat hernia repair and bowel resection  LUMBAR ROM:   AROM eval  Flexion 60% pull B back and thighs  Extension 30%   Right lateral flexion 70%   Left lateral flexion 55% tight R LB  Right rotation 50%  Left rotation 50%    (Blank rows = not tested)  LOWER EXTREMITY ROM:  end range tightness R > L hips - assessed in supine due to ACDF   Active  Right eval Left eval  Hip flexion    Hip extension    Hip abduction    Hip adduction    Hip internal rotation    Hip external rotation    Knee flexion    Knee extension    Ankle dorsiflexion    Ankle plantarflexion    Ankle inversion    Ankle eversion     (Blank rows = not tested)  LOWER EXTREMITY MMT:  assessed in supine due to ACDF   MMT Right eval Left eval  Hip flexion 4- 4  Hip extension 4- 4-  Hip abduction 4- 4-  Hip adduction    Hip internal rotation    Hip external rotation    Knee flexion    Knee extension     Ankle dorsiflexion    Ankle plantarflexion    Ankle inversion    Ankle eversion     (Blank rows = not tested)  LUMBAR SPECIAL TESTS:  Straight leg raise test: Negative and Slump test: Negative  FUNCTIONAL TESTS:  5 times sit to stand: 20.5 sec  SLS - unable to balance on either leg   GAIT: Distance walked: 40 feet Assistive device utilized: None Level of assistance: Complete Independence Comments: fwd flexed trunk; LE's in ER  [facility] Adult PT Treatment:                                                DATE: 09/18/2023 Therapeutic Exercise: Supine sciatic nerve glide Seated sciatic tensioner Bent over hip extension --> discontinued d/t pain in standing leg Neuromuscular re-ed: Bent knee fall out + YTB Bridges + hip abd iso with RTB Side Lying: Straight leg hip abd Leg arcs   TREATMENT DATE:  Review of eval findings Exercises as noted below  PATIENT EDUCATION:  Education details: Updated HEP  Person educated: Patient Education method: Explanation, Demonstration, Tactile cues, Verbal cues, and Handouts Education comprehension: verbalized understanding, returned demonstration, verbal cues required, tactile cues required, and needs further education  HOME EXERCISE PROGRAM: Access Code: [email or web address] URL: [email or web address] Date: 09/18/2023 Prepared by: Sims Duck  Exercises - Hooklying Hamstring Stretch with Strap  - 2 x daily - 7 x weekly - 1 sets - 3 reps - 30 sec  hold - Hooklying Single Knee to Chest Stretch  - 2 x daily - 7 x weekly - 1 sets - 3-5 reps - 10-15 sec  hold - Supine Sciatic Nerve Glide  - 1 x daily - 7 x weekly - 3 sets - 10 reps - Seated Sciatic Tensioner  - 1 x daily - 7 x weekly - 3 sets - 10 reps - Hooklying Single Leg Bent Knee Fallouts with Resistance  - 1 x daily - 7 x weekly - 3 sets - 10 reps - Supine  Bridge with Resistance Band  - 1 x daily - 7 x weekly - 3 sets - 10 reps - Sidelying Hip Abduction  - 1 x daily - 7 x weekly - 3 sets - 10 reps - Sidelying Diagonal Hip Abduction  - 1 x daily - 7 x weekly - 3 sets - 10 reps  ASSESSMENT:  CLINICAL IMPRESSION: Sciatic nerve glide variations incorporated to address symptoms in bilateral LE. Greater glute weakness noted on right as compared to left; glutes quick to fatigue with side lying hip abduction variations. Resistance band improved proprioceptive awareness and pelvic stability during bent knee fall outs and bridges.   EVAL: Patient is a 68 y.o. male who was seen today for physical therapy evaluation and treatment for low back pain with diagnosis of lumbar spondylosis. He had a ACDF for cervical cord compression with decreased strength due to compression 07/31/23 and remains on restrictions including gentle cervical ROM and no lifting. 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 He has been treated with chiropractic care and ESI injections. He presents with poor posture and alignment; limited trunk and LE mobility and ROM; decreased strength core and LE's; peripheral neuropathy bilat LE's; poor balance and history of frequent falls. Patient has intermittent LE pain in either LE. He reports that he has a sedentary lifestyle and fatigues quickly with physical activities. He will benefit from PT to address areas of deficits identified.   OBJECTIVE IMPAIRMENTS: Abnormal gait, decreased activity tolerance, decreased balance, decreased mobility, decreased ROM, decreased strength, impaired flexibility, improper body mechanics, postural dysfunction, and pain.   ACTIVITY LIMITATIONS: carrying, lifting, bending, sitting, standing, squatting, sleeping, and stairs  PARTICIPATION LIMITATIONS: meal prep, cleaning, laundry, shopping, community activity, and yard work  PERSONAL FACTORS: Age, Behavior pattern, Fitness,  Past/current experiences, Time since onset of injury/illness/exacerbation, and comorbidities: AODM; peripheral neuropathy, sedentary lifestyle, weakness, decreased endurance are also affecting patient's functional outcome.   REHAB POTENTIAL: Good  CLINICAL DECISION MAKING: Evolving/moderate complexity  EVALUATION COMPLEXITY: Moderate   GOALS: Goals reviewed with patient? Yes  SHORT TERM GOALS: Target date: 09/25/2023  Independent in initial HEP  Baseline: Goal status: INITIAL  2.  Increase activity tolerance with patient tolerating 20 min of exercise without fatigue reported  Baseline:  Goal status: INITIAL  3.  Patient reports walking for grocery shopping without difficulty  Baseline:  Goal status: INITIAL   LONG TERM GOALS: Target date: 10/23/2023  Increase LE  strength to 4+/5 to 5/5  Baseline:  Goal status: INITIAL  2.  Improve functional strength with patient demonstrating decrease in timed sit to stand by 3-5 sec  Baseline:  Goal status: INITIAL  3.  Improve activity tolerance with patient to tolerate 30-45 minutes of therapeutic activity and exercise with only 1-2 periods of 2-3 min rest to recovery  Baseline:  Goal status: INITIAL  4.  Independent in HEP including aquatic therapy as indicated  Baseline:  Goal status: INITIAL  5.  Improve Modified Owestry score by 10-15 points  Baseline: 30/50; 60%  Goal status: INITIAL   PLAN:  PT FREQUENCY: 2x/week  PT DURATION: 8 weeks  PLANNED INTERVENTIONS: 97164- PT Re-evaluation, 97110-Therapeutic exercises, 97530- Therapeutic activity, 97112- Neuromuscular re-education, 97535- Self Care, 96045- Manual therapy, [phone]- Gait training, [phone]- Aquatic Therapy, Patient/Family education, Balance training, Stair training, Taping, Dry Needling, and Joint mobilization.  PLAN FOR NEXT SESSION: Resistance band for bent knee fall out & bridges (RTB/GTB). Review and progress exercises; continue spine care education; manual work  and modalities as indicated    Flint Hummer, PTA 09/18/2023, 4:29 PM

## 2023-09-25 ENCOUNTER — Encounter: Payer: Self-pay | Admitting: Rehabilitative and Restorative Service Providers"

## 2023-09-25 ENCOUNTER — Ambulatory Visit: Attending: Orthopedic Surgery | Admitting: Rehabilitative and Restorative Service Providers"

## 2023-09-25 ENCOUNTER — Other Ambulatory Visit: Payer: Self-pay

## 2023-09-25 DIAGNOSIS — R2681 Unsteadiness on feet: Secondary | ICD-10-CM

## 2023-09-25 DIAGNOSIS — G8929 Other chronic pain: Secondary | ICD-10-CM

## 2023-09-25 DIAGNOSIS — M6281 Muscle weakness (generalized): Secondary | ICD-10-CM

## 2023-09-25 DIAGNOSIS — R29898 Other symptoms and signs involving the musculoskeletal system: Secondary | ICD-10-CM

## 2023-09-25 DIAGNOSIS — M47816 Spondylosis without myelopathy or radiculopathy, lumbar region: Secondary | ICD-10-CM | POA: Insufficient documentation

## 2023-09-25 DIAGNOSIS — M5442 Lumbago with sciatica, left side: Secondary | ICD-10-CM | POA: Diagnosis not present

## 2023-09-25 DIAGNOSIS — M5441 Lumbago with sciatica, right side: Secondary | ICD-10-CM | POA: Insufficient documentation

## 2023-09-25 DIAGNOSIS — M539 Dorsopathy, unspecified: Secondary | ICD-10-CM | POA: Diagnosis not present

## 2023-09-25 NOTE — Therapy (Signed)
 OUTPATIENT PHYSICAL THERAPY THORACOLUMBAR TREATMENT   Patient Name: Marcus Brock MRN: [id number] DOB:[birth year], 68 y.o., male Today's Date: 09/25/2023  END OF SESSION:  PT End of Session - 09/25/23 1552     Visit Number 1    Number of Visits 16    Date for PT Re-Evaluation 11/20/23    Authorization Type [employer] medicare $10 copay    Progress Note Due on Visit 10    PT Start Time 1400    PT Stop Time 1445    PT Time Calculation (min) 45 min    Activity Tolerance Patient tolerated treatment well              Past Medical History:  Diagnosis Date   Allergy 1980   Anxiety 1982   Arthritis 1995   Asthma    COPD (chronic obstructive pulmonary disease) (HCC) 2024   Depression 1982   Diabetes mellitus without complication (HCC)    Diabetic neuropathy (HCC)    feet   GERD (gastroesophageal reflux disease) 1992   History of hiatal hernia    Hypertension    Neuromuscular disorder (HCC)    Neuropathy bilateral arms from cervical spine   Pneumonia    Ulcer 1988   Past Surgical History:  Procedure Laterality Date   ANKLE SURGERY Left    As a child. "Almost sliced in half"   ANTERIOR CERVICAL DECOMP/DISCECTOMY FUSION N/A 07/31/2023   Procedure: ANTERIOR CERVICAL DECOMPRESSION/DISCECTOMY FUSION 1 LEVEL;  Surgeon: Virl Grimes, MD;  Location: [facility] OR;  Service: Orthopedics;  Laterality: N/A;  ANTERIOR CERVICAL DECOMPRESSION FUSION CERVICAL 3- CERVICAL 4 WITH INSTRUMENTATION AND ALLOGRAFT   APPENDECTOMY  01/2017   CHOLECYSTECTOMY  1995   COLON RESECTION N/A 04/2003   COLONOSCOPY  07/2002   12/2009; 12/2014   GANGLION CYST EXCISION Left 01/2002   left elbow   INGUINAL HERNIA REPAIR Right 2024   KNEE ARTHROSCOPY WITH MEDIAL MENISECTOMY Left 07/2017   NECK SURGERY  01/2014   NEVUS EXCISION     Back   ROTATOR CUFF REPAIR Right 12/2001   SPINE SURGERY  2018   Cervical Neck Fusion   TOOTH EXTRACTION     15 teeth removed due to infection   VASECTOMY  1996   Patient Active  Problem List   Diagnosis Date Noted   HTN, goal below 130/80 09/06/2023   Type 2 diabetes mellitus with diabetic neuropathy, without long-term current use of insulin  (HCC) 09/06/2023   Panic attack 05/28/2023   COPD exacerbation (HCC) 05/13/2023   Acute cough 05/13/2023   Primary osteoarthritis of both hands 05/02/2023   Primary osteoarthritis of both knees 05/02/2023   Polyarthralgia 04/03/2023   Cervical fusion syndrome 01/14/2023   Peripheral polyneuropathy 01/14/2023   Weak urinary stream 01/14/2023   Chronic obstructive pulmonary disease (HCC) 01/02/2023   Type 2 diabetes mellitus without complication, without long-term current use of insulin  (HCC) 12/05/2022   Lumbar spondylosis 12/05/2022   History of CAD (coronary artery disease) 12/05/2022   Mixed hyperlipidemia 12/05/2022   GAD (generalized anxiety disorder) 12/05/2022   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 episode (HCC) 12/05/2022   Gastroesophageal reflux disease without esophagitis 12/05/2022   Wheezing 12/05/2022   Bilateral inguinal hernia without obstruction or gangrene 05/15/2022   Anxiety 04/25/2022   S/P left knee arthroscopy 07/26/2017   Acute medial meniscus tear of left knee 07/03/2017   Perforated appendicitis 01/29/2017   Anterior basement membrane dystrophy 02/27/2016   Hematuria, microscopic 09/16/2012    PCP: Dr  Josepha Nickels,  REFERRING PROVIDER: Dr Virl Grimes   Dr Annemarie Kil LBP  REFERRING DIAG: Neck pain with history of spinal surgery;  Lumbar spondylosis  Rationale for Evaluation and Treatment: Rehabilitation  THERAPY DIAG:  Other symptoms and signs involving the musculoskeletal system  Muscle weakness (generalized)  Cervical dysfunction  Unsteadiness  Chronic bilateral low back pain with sciatica, sciatica laterality unspecified  ONSET DATE: 08/22/23 flare up of long standing back pain  SUBJECTIVE:                                                                                                                                                                                            SUBJECTIVE STATEMENT: RE-EVAL; 09/25/23; patient reports that he had as second cervical disc fusion 07/29/23 to remove old hardware and fuse adjacent levels. First cervical fusion was C4/5 in February, 2018. [name] thinks he is now fused C3 - C5. Surgery was done on an outpatient basis. He has had some post op soreness and pain. He has arthritis in both arms and hands. He trimmed some bushes yesterday and had stiffness and tightness in R > L hand.    EVAL: Patient reports that he has has sciatica for 35 years. He had a flare up of R LB and R LE about a week ago. Symptoms have resolved today. He has some good days and bad days. He does not know what causes the difference in how he feels. He has had epidural steroid shots for years. He has pain in both sides at different times. He has sensation of weakness and stiffness. Has been sedentary for the past few months because of neck pain.   PERTINENT HISTORY:  ACDF 07/31/23; first cervical fusion 2018; arthritis; AODM; HTN; anxiety; has had 14 surgeries - neck, shoulder, knee, ankle, elbow surgeries, appendectomy, gallbladder surgery, double hernia surgeries    PAIN:  Are you having pain? Yes: NPRS scale: 3/10 in neck  Pain location: cervical/thoracic spine to bilat upper traps  Pain description: soreness   Aggravating factors: use of UE's   Relieving factors: heating pad; ice; OTC meds; muscle relaxer    Are you having pain? Yes: NPRS scale: 2/10   Pain location: R LB Pain description: soreness; - at times posterior hip, thigh, calf to ankle  Aggravating factors: overuse   Relieving factors: heating pad; OTC meds; muscle relaxer   PRECAUTIONS: Other: ACDF 07/29/23 - limited to gentle cervical movement; no lifting > 10 pounds   RED FLAGS: None   WEIGHT BEARING RESTRICTIONS: No  FALLS:  Has patient fallen in  last 6 months? Yes. 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  DIAGNOSTIC FINDINGS:  MRi lumbar spine 03/11/23; 1. Progressive multilevel lumbar spondylosis as described above. 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 2. Worsened now moderate bilateral lateral recess stenosis and mild-to-moderate spinal canal stenosis at L4-L5.  PATIENT SURVEYS:  Modified Oswestry 30/50; 60% limitation  09/25/23: Quick DASH 31.8/100; 31.8%   COGNITION: Overall cognitive status: Within functional limits for tasks assessed     SENSATION: Peripherial neuropathy bilat LE's x 10 yrs   MUSCLE LENGTH: Hamstrings: Right 55 deg; Left 65 deg   POSTURE: rounded shoulders, forward head, decreased lumbar lordosis, increased thoracic kyphosis, weight shift left, and L LE shorter that R in standing   PALPATION: Tightness bilat hip flexors - hx of bilat hernia repair and bowel resection  CERVICAL ROM: stiffness with all cervical movements   Active ROM A/PROM (deg) eval  Flexion 46  Extension 13  Right lateral flexion 24  Left lateral  flexion 14  Right rotation 39  Left rotation 23   (Blank rows = not tested)   UE ROM:  Shoulder  Flexion R - 140; L 140  Extension R - 74; L 60  Abduction R - 133; L 133  IR R - T11/12; T12/L1             ER R - 75; L 70  UE STRENGTH:  Bilat UE strength - grossly WFL not assessed resistively due to post op restrictions  Grip strength R 67#; L 65#   LUMBAR ROM:   AROM eval  Flexion 60% pull B back and thighs  Extension 30%   Right lateral flexion 70%   Left lateral flexion 55% tight R LB  Right rotation 50%  Left rotation 50%    (Blank rows = not tested)  LOWER EXTREMITY ROM:  end range tightness R > L hips - assessed in supine due to ACDF   Active  Right eval Left eval  Hip flexion    Hip extension    Hip abduction    Hip adduction    Hip internal rotation    Hip external rotation    Knee flexion    Knee extension    Ankle dorsiflexion    Ankle plantarflexion    Ankle inversion    Ankle eversion     (Blank rows = not tested)  LOWER EXTREMITY MMT:  assessed in supine due to ACDF   MMT Right eval Left eval  Hip flexion 4- 4  Hip extension 4- 4-  Hip abduction 4- 4-  Hip adduction    Hip internal rotation    Hip external rotation    Knee flexion  Knee extension    Ankle dorsiflexion    Ankle plantarflexion    Ankle inversion    Ankle eversion     (Blank rows = not tested)  LUMBAR SPECIAL TESTS:  Straight leg raise test: Negative and Slump test: Negative  FUNCTIONAL TESTS:  5 times sit to stand: 20.5 sec  SLS - unable to balance on either leg   09/25/23: 5 times sit to stand: 18.49 sec     SLS R 5 sec; L 2 sec  GAIT: Distance walked: 40 feet Assistive device utilized: None Level of assistance: Complete Independence Comments: fwd flexed trunk; LE's in ER  [facility] Adult PT Treatment:                                                DATE: 09/25/2023  Review evaluation findings and POC   Therapeutic exercise:  Chest lift with focus on thoracic  extension  Chin tuck Continue with HEP from prior treatment sessions for LBP   [facility] Adult PT Treatment:                                                DATE: 09/18/2023 Therapeutic Exercise: Supine sciatic nerve glide Seated sciatic tensioner Bent over hip extension --> discontinued d/t pain in standing leg Neuromuscular re-ed: Bent knee fall out + YTB Bridges + hip abd iso with RTB Side Lying: Straight leg hip abd Leg arcs   TREATMENT DATE:  Review of eval findings Exercises as noted below                                                                                                                                  PATIENT EDUCATION:  Education details: Updated HEP  Person educated: Patient Education method: Explanation, Demonstration, Tactile cues, Verbal cues, and Handouts Education comprehension: verbalized understanding, returned demonstration, verbal cues required, tactile cues required, and needs further education  HOME EXERCISE PROGRAM: Access Code: [email or web address] URL: [email or web address] Date: 09/18/2023 Prepared by: Sims Duck  Exercises - Hooklying Hamstring Stretch with Strap  - 2 x daily - 7 x weekly - 1 sets - 3 reps - 30 sec  hold - Hooklying Single Knee to Chest Stretch  - 2 x daily - 7 x weekly - 1 sets - 3-5 reps - 10-15 sec  hold - Supine Sciatic Nerve Glide  - 1 x daily - 7 x weekly - 3 sets - 10 reps - Seated Sciatic Tensioner  - 1 x daily - 7 x weekly - 3 sets - 10 reps - Hooklying Single Leg Bent Knee Fallouts with Resistance  - 1 x daily - 7 x weekly - 3 sets -  10 reps - Supine Bridge with Resistance Band  - 1 x daily - 7 x weekly - 3 sets - 10 reps - Sidelying Hip Abduction  - 1 x daily - 7 x weekly - 3 sets - 10 reps - Sidelying Diagonal Hip Abduction  - 1 x daily - 7 x weekly - 3 sets - 10 reps  ASSESSMENT:  CLINICAL IMPRESSION: RE-EVAL: Patient is a 68 yo male who presents following ACDF 07/31/23. [name] repors that he has improved  since cervical surgery. He has better cervical and shoulder ROM and is using UEs for more functional activities. Per patient report, he has no restrictions for cervical or shoulder AROM. He has lifting restriction of no more than 10#. Patient presents with poor cervical and thoracic posture and alignment; limited spinal mobility and ROM; decreased functional strength bilat UE's; decreased grip strength bilat; muscular tightness to palpation through the cervical and upper trap musculature; pain and stiffness in the cervical and lumbar spine; decreased balance. Patient will benefit from spinal stabilization and core strengthening; postural correction; U/LE strengthening; progression of functional activity level and endurance. Patient is an excellent candidate for physical therapy to address deficits identified.   EVAL: Patient is a 68 y.o. male who was seen today for physical therapy evaluation and treatment for low back pain with diagnosis of lumbar spondylosis. He had a ACDF for cervical cord compression with decreased strength due to compression 07/31/23 and remains on restrictions including gentle cervical ROM and no lifting. 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 He has been treated with chiropractic care and ESI injections. He presents with poor posture and alignment; limited trunk and LE mobility and ROM; decreased strength core and LE's; peripheral neuropathy bilat LE's; poor balance and history of frequent falls. Patient has intermittent LE pain in either LE. He reports that he has a sedentary lifestyle and fatigues quickly with physical activities. He will benefit from PT to address areas of deficits identified.   OBJECTIVE IMPAIRMENTS: Abnormal gait, decreased activity tolerance, decreased balance, decreased mobility, decreased ROM, decreased strength, impaired flexibility, improper body mechanics, postural dysfunction, and pain.   ACTIVITY LIMITATIONS:  carrying, lifting, bending, sitting, standing, squatting, sleeping, and stairs  PARTICIPATION LIMITATIONS: meal prep, cleaning, laundry, shopping, community activity, and yard work  PERSONAL FACTORS: Age, Behavior pattern, Fitness, Past/current experiences, Time since onset of injury/illness/exacerbation, and comorbidities: AODM; peripheral neuropathy, sedentary lifestyle, weakness, decreased endurance are also affecting patient's functional outcome.   REHAB POTENTIAL: Good  CLINICAL DECISION MAKING: Evolving/moderate complexity  EVALUATION COMPLEXITY: Moderate   GOALS: Goals reviewed with patient? Yes  SHORT TERM GOALS: Target date: 10/23/2023   Independent in initial HEP  Baseline: Goal status: INITIAL  2.  Increase activity tolerance with patient tolerating 20 min of exercise without fatigue reported  Baseline:  Goal status: INITIAL  3.  Patient reports walking for grocery shopping without difficulty  Baseline:  Goal status: INITIAL   LONG TERM GOALS: Target date: 11/20/2023   Increase U/LE strength to 4+/5 to 5/5  Baseline:  Goal status: INITIAL  2.  Improve functional strength with patient demonstrating decrease in timed sit to stand by 3-5 sec  Baseline:  Goal status: INITIAL  3.  Improve activity tolerance with patient to tolerate 30-45 minutes of therapeutic activity and exercise with only 1-2 periods of 2-3 min rest to recovery  Baseline:  Goal status: INITIAL  4.  Independent in HEP including aquatic therapy  as indicated  Baseline:  Goal status: INITIAL  5.  Improve Modified Owestry score by 10-15 points  Baseline: 30/50; 60%  Goal status: INITIAL   6. Increase cervical AROM in extension, lateral flexion, rotation by 5-7 degrees to improve functional activity - checking traffic when driving    Baseline:    Goal status; INITIAL    7. Decrease pain in the cervical and lumbar spine by 50-70% allowing patient to increase functional activity level     Baseline:    Goal status: INITIAL    8. Improve Single leg stance time to =/> 10 sec without UE support to improve balance and stability in standing   Baseline:   Goal status: INITIAL   9. Improve Quick DASH score by 10 points demonstrating improved UE function  Baseline:  31.8/100; 31.8% Goal status: INITIAL  PLAN:  PT FREQUENCY: 2x/week  PT DURATION: 8 weeks  PLANNED INTERVENTIONS: 97164- PT Re-evaluation, 97110-Therapeutic exercises, 97530- Therapeutic activity, 97112- Neuromuscular re-education, 97535- Self Care, 98119- Manual therapy, [id number]- Gait training, [phone]- Aquatic Therapy, Patient/Family education, Balance training, Stair training, Taping, Dry Needling, and Joint mobilization.  PLAN FOR NEXT SESSION:  Review and progress exercises; continue spine care education; manual work and modalities as indicated Add AROM cervical spine, bilat shoulders as tolerated; improve thoracic extension;  resistance band for bent knee fall out & bridges (RTB/GTB); add balance activities    [employer], PT 09/25/2023, 3:54 PM

## 2023-09-27 ENCOUNTER — Ambulatory Visit

## 2023-09-27 DIAGNOSIS — R2681 Unsteadiness on feet: Secondary | ICD-10-CM

## 2023-09-27 DIAGNOSIS — M5442 Lumbago with sciatica, left side: Secondary | ICD-10-CM | POA: Diagnosis not present

## 2023-09-27 DIAGNOSIS — R29898 Other symptoms and signs involving the musculoskeletal system: Secondary | ICD-10-CM

## 2023-09-27 DIAGNOSIS — M5441 Lumbago with sciatica, right side: Secondary | ICD-10-CM | POA: Diagnosis not present

## 2023-09-27 DIAGNOSIS — M47816 Spondylosis without myelopathy or radiculopathy, lumbar region: Secondary | ICD-10-CM

## 2023-09-27 DIAGNOSIS — M6281 Muscle weakness (generalized): Secondary | ICD-10-CM

## 2023-09-27 DIAGNOSIS — M539 Dorsopathy, unspecified: Secondary | ICD-10-CM

## 2023-09-27 DIAGNOSIS — G8929 Other chronic pain: Secondary | ICD-10-CM | POA: Diagnosis not present

## 2023-09-27 NOTE — Therapy (Signed)
 OUTPATIENT PHYSICAL THERAPY THORACOLUMBAR TREATMENT   Patient Name: Marcus Brock MRN: [id number] DOB:[birth year], 68 y.o., male Today's Date: 09/27/2023  END OF SESSION:  PT End of Session - 09/27/23 1105     Visit Number 4    Number of Visits 16    Date for PT Re-Evaluation 11/20/23    Authorization Type [employer] medicare $10 copay    Progress Note Due on Visit 10    PT Start Time 1105    PT Stop Time 1156    PT Time Calculation (min) 51 min    Activity Tolerance Patient tolerated treatment well    Behavior During Therapy WFL for tasks assessed/performed            Past Medical History:  Diagnosis Date   Allergy 1980   Anxiety 1982   Arthritis 1995   Asthma    COPD (chronic obstructive pulmonary disease) (HCC) 2024   Depression 1982   Diabetes mellitus without complication (HCC)    Diabetic neuropathy (HCC)    feet   GERD (gastroesophageal reflux disease) 1992   History of hiatal hernia    Hypertension    Neuromuscular disorder (HCC)    Neuropathy bilateral arms from cervical spine   Pneumonia    Ulcer 1988   Past Surgical History:  Procedure Laterality Date   ANKLE SURGERY Left    As a child. "Almost sliced in half"   ANTERIOR CERVICAL DECOMP/DISCECTOMY FUSION N/A 07/31/2023   Procedure: ANTERIOR CERVICAL DECOMPRESSION/DISCECTOMY FUSION 1 LEVEL;  Surgeon: Virl Grimes, MD;  Location: [facility] OR;  Service: Orthopedics;  Laterality: N/A;  ANTERIOR CERVICAL DECOMPRESSION FUSION CERVICAL 3- CERVICAL 4 WITH INSTRUMENTATION AND ALLOGRAFT   APPENDECTOMY  01/2017   CHOLECYSTECTOMY  1995   COLON RESECTION N/A 04/2003   COLONOSCOPY  07/2002   12/2009; 12/2014   GANGLION CYST EXCISION Left 01/2002   left elbow   INGUINAL HERNIA REPAIR Right 2024   KNEE ARTHROSCOPY WITH MEDIAL MENISECTOMY Left 07/2017   NECK SURGERY  01/2014   NEVUS EXCISION     Back   ROTATOR CUFF REPAIR Right 12/2001   SPINE SURGERY  2018   Cervical Neck Fusion   TOOTH EXTRACTION     15 teeth removed  due to infection   VASECTOMY  1996   Patient Active Problem List   Diagnosis Date Noted   HTN, goal below 130/80 09/06/2023   Type 2 diabetes mellitus with diabetic neuropathy, without long-term current use of insulin  (HCC) 09/06/2023   Panic attack 05/28/2023   COPD exacerbation (HCC) 05/13/2023   Acute cough 05/13/2023   Primary osteoarthritis of both hands 05/02/2023   Primary osteoarthritis of both knees 05/02/2023   Polyarthralgia 04/03/2023   Cervical fusion syndrome 01/14/2023   Peripheral polyneuropathy 01/14/2023   Weak urinary stream 01/14/2023   Chronic obstructive pulmonary disease (HCC) 01/02/2023   Type 2 diabetes mellitus without complication, without long-term current use of insulin  (HCC) 12/05/2022   Lumbar spondylosis 12/05/2022   History of CAD (coronary artery disease) 12/05/2022   Mixed hyperlipidemia 12/05/2022   GAD (generalized anxiety disorder) 12/05/2022   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 episode (HCC) 12/05/2022   Gastroesophageal reflux disease without esophagitis 12/05/2022   Wheezing 12/05/2022   Bilateral inguinal hernia without obstruction or gangrene 05/15/2022   Anxiety 04/25/2022   S/P left knee arthroscopy 07/26/2017   Acute medial meniscus tear of left knee 07/03/2017   Perforated appendicitis 01/29/2017   Anterior basement membrane dystrophy 02/27/2016  Hematuria, microscopic 09/16/2012    PCP: Dr Josepha Nickels,  REFERRING PROVIDER: Dr Virl Grimes   Dr Annemarie Kil LBP  REFERRING DIAG: Neck pain with history of spinal surgery;  Lumbar spondylosis  Rationale for Evaluation and Treatment: Rehabilitation  THERAPY DIAG:  Other symptoms and signs involving the musculoskeletal system  Muscle weakness (generalized)  Cervical dysfunction  Unsteadiness  Chronic bilateral low back pain with sciatica, sciatica laterality unspecified  Lumbar spondylosis  ONSET DATE: 08/22/23 flare up of long  standing back pain  SUBJECTIVE:                                                                                                                                                                                           SUBJECTIVE STATEMENT: Patient reports "my sciatica is really flared up on left side today from cleaning pool and doing yard work". Patient states he only did stretches from HEP due to soreness/pain from yard work.   RE-EVAL; 09/25/23; patient reports that he had as second cervical disc fusion 07/29/23 to remove old hardware and fuse adjacent levels. First cervical fusion was C4/5 in February, 2018. Marcus Brock thinks he is now fused C3 - C5. Surgery was done on an outpatient basis. He has had some post op soreness and pain. He has arthritis in both arms and hands. He trimmed some bushes yesterday and had stiffness and tightness in R > L hand.    EVAL: Patient reports that he has has sciatica for 35 years. He had a flare up of R LB and R LE about a week ago. Symptoms have resolved today. He has some good days and bad days. He does not know what causes the difference in how he feels. He has had epidural steroid shots for years. He has pain in both sides at different times. He has sensation of weakness and stiffness. Has been sedentary for the past few months because of neck pain.   PERTINENT HISTORY:  ACDF 07/31/23; first cervical fusion 2018; arthritis; AODM; HTN; anxiety; has had 14 surgeries - neck, shoulder, knee, ankle, elbow surgeries, appendectomy, gallbladder surgery, double hernia surgeries    PAIN:  Are you having pain? Yes: NPRS scale: 3/10 in neck  Pain location: cervical/thoracic spine to bilat upper traps  Pain description: soreness   Aggravating factors: use of UE's   Relieving factors: heating pad; ice; OTC meds; muscle relaxer    Are you having pain? Yes: NPRS scale: 2/10   Pain location: R LB Pain description: soreness; - at times posterior hip, thigh, calf to ankle   Aggravating factors: overuse   Relieving factors: heating pad; OTC  meds; muscle relaxer   PRECAUTIONS: Other: ACDF 07/29/23 - limited to gentle cervical movement; no lifting > 10 pounds   RED FLAGS: None   WEIGHT BEARING RESTRICTIONS: No  FALLS:  Has patient fallen in last 6 months? Yes. 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  DIAGNOSTIC FINDINGS:  MRi lumbar spine 03/11/23; 1. Progressive multilevel lumbar spondylosis as described above. 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 2. Worsened now moderate bilateral lateral recess stenosis and mild-to-moderate spinal canal stenosis at L4-L5.  PATIENT SURVEYS:  Modified Oswestry 30/50; 60% limitation  09/25/23: Quick DASH 31.8/100; 31.8%   COGNITION: Overall cognitive status: Within functional limits for tasks assessed     SENSATION: Peripherial neuropathy bilat LE's x 10 yrs   MUSCLE LENGTH: Hamstrings: Right 55 deg; Left 65 deg   POSTURE: rounded shoulders, forward head, decreased lumbar lordosis, increased thoracic kyphosis, weight shift left, and L LE  shorter that R in standing   PALPATION: Tightness bilat hip flexors - hx of bilat hernia repair and bowel resection  CERVICAL ROM: stiffness with all cervical movements   Active ROM A/PROM (deg) eval  Flexion 46  Extension 13  Right lateral flexion 24  Left lateral flexion 14  Right rotation 39  Left rotation 23   (Blank rows = not tested)   UE ROM:  Shoulder  Flexion R - 140; L 140  Extension R - 74; L 60  Abduction R - 133; L 133  IR R - T11/12; T12/L1             ER R - 75; L 70  UE STRENGTH:  Bilat UE strength - grossly WFL not assessed resistively due to post op restrictions  Grip strength R 67#; L 65#   LUMBAR ROM:   AROM eval  Flexion 60% pull B back and thighs  Extension 30%   Right lateral flexion 70%   Left lateral flexion 55% tight R LB  Right rotation 50%  Left rotation 50%    (Blank rows = not tested)  LOWER EXTREMITY ROM:  end range tightness R > L hips - assessed in supine due to ACDF   Active  Right eval Left eval  Hip flexion    Hip extension    Hip abduction    Hip adduction    Hip internal rotation    Hip external rotation    Knee flexion    Knee extension    Ankle dorsiflexion    Ankle plantarflexion    Ankle inversion    Ankle eversion     (Blank rows = not tested)  LOWER EXTREMITY MMT:  assessed  in supine due to ACDF   MMT Right eval Left eval  Hip flexion 4- 4  Hip extension 4- 4-  Hip abduction 4- 4-  Hip adduction    Hip internal rotation    Hip external rotation    Knee flexion    Knee extension    Ankle dorsiflexion    Ankle plantarflexion    Ankle inversion    Ankle eversion     (Blank rows = not tested)  LUMBAR SPECIAL TESTS:  Straight leg raise test: Negative and Slump test: Negative  FUNCTIONAL TESTS:  5 times sit to stand: 20.5 sec  SLS - unable to balance on either leg   09/25/23: 5 times sit to stand: 18.49 sec     SLS R 5 sec; L 2 sec  GAIT: Distance walked: 40 feet Assistive device utilized:  None Level of assistance: Complete Independence Comments: fwd flexed trunk; LE's in ER   [facility] Adult PT Treatment:                                                DATE: 09/27/2023 Therapeutic Exercise: Supine sciatic nerve glide  Seated sciatic tensioner Supine cervical rotation  Seated UT stretch --> active & passive Bridges  Manual Therapy: STM cervical paraspinals, cervical upglides, SO, SOR, UT/LS Neuromuscular re-ed: TA activation + breathing Diaphragmatic breathing + TA activation Bent knee fall out with GTB + ab bracing Supine: Cervical retraction with towel roll Shoulder horizontal abduction + RTB Standing scap squeezes with noodle Upright postural cues for chest lift and abdominal bracing --> added shoulder ER AROM   [facility] Adult PT Treatment:                                                DATE: 09/25/2023 Review evaluation findings and POC   Therapeutic exercise:  Chest lift with focus on thoracic extension  Chin tuck Continue with HEP from prior treatment sessions for LBP   [facility] Adult PT Treatment:                                                DATE: 09/18/2023 Therapeutic Exercise: Supine sciatic nerve glide Seated sciatic tensioner Bent over hip extension --> discontinued d/t pain in standing leg Neuromuscular re-ed: Bent knee fall out + YTB Bridges + hip abd iso with RTB Side Lying: Straight leg hip abd Leg arcs                                                                                                       PATIENT EDUCATION:  Education details: Updated HEP  Person educated: Patient Education method: Explanation, Demonstration, Tactile cues, Verbal cues,  and Handouts Education comprehension: verbalized understanding, returned demonstration, verbal cues required, tactile cues required, and needs further education  HOME EXERCISE PROGRAM: Access Code: [email or web address] URL: [email or web address] Date: 09/27/2023 Prepared by: Sims Duck  Exercises - Hooklying Hamstring Stretch with Strap  - 2 x daily - 7 x weekly - 1 sets - 3 reps - 30 sec  hold - Hooklying Single Knee to Chest Stretch  - 2 x daily - 7 x weekly - 1 sets - 3-5 reps - 10-15 sec  hold - Supine Sciatic Nerve Glide  - 1 x daily - 7 x weekly - 3 sets - 10 reps - Seated Sciatic Tensioner  - 1 x daily - 7 x weekly - 3 sets - 10 reps - Hooklying Single Leg Bent Knee Fallouts with Resistance  - 1 x daily - 7 x weekly - 3 sets - 10 reps - Supine Bridge with Resistance Band  - 1 x daily - 7 x weekly - 3 sets - 10 reps - Sidelying Hip Abduction  - 1 x daily - 7 x weekly - 3 sets - 10 reps - Sidelying Over and Back  - 1 x daily - 7 x weekly - 3 sets - 10 reps - Seated Upper Trap Stretch  - 3 x daily - 7 x weekly - 1 sets - 3-5 reps - 10-30 sec hold - Seated Scapular Retraction  - 1 x daily - 7 x weekly - 3 sets - 10 reps - 5 sec hold - Supine Cervical Retraction with Towel  - 1 x daily - 7 x weekly - 3 sets - 10 reps - 5 sec hold   ASSESSMENT:  CLINICAL IMPRESSION: Postural exercises and stretches added to HEP. Cervical retraction mechanics progressed as tolerated. Frequent cueing provided to improve TA activation and core engagement to decrease lumbar compensation with supine and standing exercises. Patient had difficulty maintaining upright postural cues with added core stabilization component.   RE-EVAL: Patient is a 68 yo male who presents following ACDF 07/31/23. Marcus Brock repors that he has improved since cervical surgery. He has better cervical and shoulder ROM and is using UEs for more functional activities. Per patient report, he has no restrictions for cervical or shoulder AROM. He has lifting restriction of no more than 10#. Patient presents with poor cervical and thoracic posture and alignment; limited spinal mobility and ROM; decreased functional strength bilat UE's; decreased grip strength bilat; muscular tightness to palpation through the cervical and upper  trap musculature; pain and stiffness in the cervical and lumbar spine; decreased balance. Patient will benefit from spinal stabilization and core strengthening; postural correction; U/LE strengthening; progression of functional activity level and endurance. Patient is an excellent candidate for physical therapy to address deficits identified.   EVAL: Patient is a 68 y.o. male who was seen today for physical therapy evaluation and treatment for low back pain with diagnosis of lumbar spondylosis. He had a ACDF for cervical cord compression with decreased strength due to compression 07/31/23 and remains on restrictions including gentle cervical ROM and no lifting. 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 He has been treated with chiropractic care and ESI injections. He presents with poor posture and alignment; limited trunk and LE mobility and ROM; decreased strength core and LE's; peripheral neuropathy bilat LE's; poor balance and history of frequent falls. Patient has intermittent LE pain in either LE. He reports that he has a sedentary lifestyle  and fatigues quickly with physical activities. He will benefit from PT to address areas of deficits identified.   OBJECTIVE IMPAIRMENTS: Abnormal gait, decreased activity tolerance, decreased balance, decreased mobility, decreased ROM, decreased strength, impaired flexibility, improper body mechanics, postural dysfunction, and pain.   ACTIVITY LIMITATIONS: carrying, lifting, bending, sitting, standing, squatting, sleeping, and stairs  PARTICIPATION LIMITATIONS: meal prep, cleaning, laundry, shopping, community activity, and yard work  PERSONAL FACTORS: Age, Behavior pattern, Fitness, Past/current experiences, Time since onset of injury/illness/exacerbation, and comorbidities: AODM; peripheral neuropathy, sedentary lifestyle, weakness, decreased endurance are also affecting patient's functional outcome.   REHAB  POTENTIAL: Good  CLINICAL DECISION MAKING: Evolving/moderate complexity  EVALUATION COMPLEXITY: Moderate   GOALS: Goals reviewed with patient? Yes  SHORT TERM GOALS: Target date: 10/23/2023  Independent in initial HEP  Baseline: Goal status: INITIAL  2.  Increase activity tolerance with patient tolerating 20 min of exercise without fatigue reported  Baseline:  Goal status: INITIAL  3.  Patient reports walking for grocery shopping without difficulty  Baseline:  Goal status: INITIAL   LONG TERM GOALS: Target date: 11/20/2023  Increase U/LE strength to 4+/5 to 5/5  Baseline:  Goal status: INITIAL  2.  Improve functional strength with patient demonstrating decrease in timed sit to stand by 3-5 sec  Baseline:  Goal status: INITIAL  3.  Improve activity tolerance with patient to tolerate 30-45 minutes of therapeutic activity and exercise with only 1-2 periods of 2-3 min rest to recovery  Baseline:  Goal status: INITIAL  4.  Independent in HEP including aquatic therapy as indicated  Baseline:  Goal status: INITIAL  5.  Improve Modified Owestry score by 10-15 points  Baseline: 30/50; 60%  Goal status: INITIAL   6. Increase cervical AROM in extension, lateral flexion, rotation by 5-7 degrees to improve functional activity - checking traffic when driving    Baseline:    Goal status; INITIAL    7. Decrease pain in the cervical and lumbar spine by 50-70% allowing patient to increase functional activity level    Baseline:    Goal status: INITIAL    8. Improve Single leg stance time to =/> 10 sec without UE support to improve balance and stability in standing   Baseline:   Goal status: INITIAL   9. Improve Quick DASH score by 10 points demonstrating improved UE function  Baseline:  31.8/100; 31.8% Goal status: INITIAL  PLAN:  PT FREQUENCY: 2x/week  PT DURATION: 8 weeks  PLANNED INTERVENTIONS: 97164- PT Re-evaluation, 97110-Therapeutic exercises, 97530- Therapeutic  activity, 97112- Neuromuscular re-education, 97535- Self Care, [id number]- Manual therapy, [phone]- Gait training, [phone]- Aquatic Therapy, Patient/Family education, Balance training, Stair training, Taping, Dry Needling, and Joint mobilization.  PLAN FOR NEXT SESSION:  Review and progress exercises; continue spine care education; manual work and modalities as indicated Add AROM cervical spine to HEP, bilat shoulders as tolerated; improve thoracic extension; add balance activities    Flint Hummer, PTA 09/27/2023, 11:56 AM

## 2023-09-30 ENCOUNTER — Ambulatory Visit

## 2023-09-30 DIAGNOSIS — M539 Dorsopathy, unspecified: Secondary | ICD-10-CM | POA: Diagnosis not present

## 2023-09-30 DIAGNOSIS — M47816 Spondylosis without myelopathy or radiculopathy, lumbar region: Secondary | ICD-10-CM

## 2023-09-30 DIAGNOSIS — R2681 Unsteadiness on feet: Secondary | ICD-10-CM

## 2023-09-30 DIAGNOSIS — M6281 Muscle weakness (generalized): Secondary | ICD-10-CM | POA: Diagnosis not present

## 2023-09-30 DIAGNOSIS — R29898 Other symptoms and signs involving the musculoskeletal system: Secondary | ICD-10-CM | POA: Diagnosis not present

## 2023-09-30 DIAGNOSIS — M5441 Lumbago with sciatica, right side: Secondary | ICD-10-CM | POA: Diagnosis not present

## 2023-09-30 DIAGNOSIS — G8929 Other chronic pain: Secondary | ICD-10-CM

## 2023-09-30 DIAGNOSIS — M5442 Lumbago with sciatica, left side: Secondary | ICD-10-CM | POA: Diagnosis not present

## 2023-09-30 NOTE — Therapy (Signed)
 OUTPATIENT PHYSICAL THERAPY THORACOLUMBAR TREATMENT   Patient Name: Marcus Brock MRN: [id number] DOB:09-Dec-1955, 68 y.o., male Today's Date: 09/30/2023  END OF SESSION:  PT End of Session - 09/30/23 1405     Visit Number 5    Number of Visits 16    Date for PT Re-Evaluation 11/20/23    Authorization Type [employer] medicare $10 copay    Progress Note Due on Visit 10    PT Start Time 1405    PT Stop Time 1445    PT Time Calculation (min) 40 min    Activity Tolerance Patient tolerated treatment well    Behavior During Therapy WFL for tasks assessed/performed            Past Medical History:  Diagnosis Date   Allergy 1980   Anxiety 1982   Arthritis 1995   Asthma    COPD (chronic obstructive pulmonary disease) (HCC) 2024   Depression 1982   Diabetes mellitus without complication (HCC)    Diabetic neuropathy (HCC)    feet   GERD (gastroesophageal reflux disease) 1992   History of hiatal hernia    Hypertension    Neuromuscular disorder (HCC)    Neuropathy bilateral arms from cervical spine   Pneumonia    Ulcer 1988   Past Surgical History:  Procedure Laterality Date   ANKLE SURGERY Left    As a child. "Almost sliced in half"   ANTERIOR CERVICAL DECOMP/DISCECTOMY FUSION N/A 07/31/2023   Procedure: ANTERIOR CERVICAL DECOMPRESSION/DISCECTOMY FUSION 1 LEVEL;  Surgeon: Virl Grimes, MD;  Location: [facility] OR;  Service: Orthopedics;  Laterality: N/A;  ANTERIOR CERVICAL DECOMPRESSION FUSION CERVICAL 3- CERVICAL 4 WITH INSTRUMENTATION AND ALLOGRAFT   APPENDECTOMY  01/2017   CHOLECYSTECTOMY  1995   COLON RESECTION N/A 04/2003   COLONOSCOPY  07/2002   12/2009; 12/2014   GANGLION CYST EXCISION Left 01/2002   left elbow   INGUINAL HERNIA REPAIR Right 2024   KNEE ARTHROSCOPY WITH MEDIAL MENISECTOMY Left 07/2017   NECK SURGERY  01/2014   NEVUS EXCISION     Back   ROTATOR CUFF REPAIR Right 12/2001   SPINE SURGERY  2018   Cervical Neck Fusion   TOOTH EXTRACTION     15 teeth removed  due to infection   VASECTOMY  1996   Patient Active Problem List   Diagnosis Date Noted   HTN, goal below 130/80 09/06/2023   Type 2 diabetes mellitus with diabetic neuropathy, without long-term current use of insulin  (HCC) 09/06/2023   Panic attack 05/28/2023   COPD exacerbation (HCC) 05/13/2023   Acute cough 05/13/2023   Primary osteoarthritis of both hands 05/02/2023   Primary osteoarthritis of both knees 05/02/2023   Polyarthralgia 04/03/2023   Cervical fusion syndrome 01/14/2023   Peripheral polyneuropathy 01/14/2023   Weak urinary stream 01/14/2023   Chronic obstructive pulmonary disease (HCC) 01/02/2023   Type 2 diabetes mellitus without complication, without long-term current use of insulin  (HCC) 12/05/2022   Lumbar spondylosis 12/05/2022   History of CAD (coronary artery disease) 12/05/2022   Mixed hyperlipidemia 12/05/2022   GAD (generalized anxiety disorder) 12/05/2022   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 episode (HCC) 12/05/2022   Gastroesophageal reflux disease without esophagitis 12/05/2022   Wheezing 12/05/2022   Bilateral inguinal hernia without obstruction or gangrene 05/15/2022   Anxiety 04/25/2022   S/P left knee arthroscopy 07/26/2017   Acute medial meniscus tear of left knee 07/03/2017   Perforated appendicitis 01/29/2017   Anterior basement membrane dystrophy 02/27/2016  Hematuria, microscopic 09/16/2012    PCP: Dr Josepha Nickels,  REFERRING PROVIDER: Dr Virl Grimes   Dr Annemarie Kil LBP  REFERRING DIAG: Neck pain with history of spinal surgery;  Lumbar spondylosis  Rationale for Evaluation and Treatment: Rehabilitation  THERAPY DIAG:  Other symptoms and signs involving the musculoskeletal system  Muscle weakness (generalized)  Cervical dysfunction  Unsteadiness  Chronic bilateral low back pain with sciatica, sciatica laterality unspecified  Lumbar spondylosis  ONSET DATE: 08/22/23 flare up of long  standing back pain  SUBJECTIVE:                                                                                                                                                                                           SUBJECTIVE STATEMENT: Patient reports he was very tired after last PT session. Patient states neck is stiff and had a hard time finding a comfortable position to sleep in.    RE-EVAL; 09/25/23; patient reports that he had as second cervical disc fusion 07/29/23 to remove old hardware and fuse adjacent levels. First cervical fusion was C4/5 in February, 2018. [name] thinks he is now fused C3 - C5. Surgery was done on an outpatient basis. He has had some post op soreness and pain. He has arthritis in both arms and hands. He trimmed some bushes yesterday and had stiffness and tightness in R > L hand.    EVAL: Patient reports that he has has sciatica for 35 years. He had a flare up of R LB and R LE about a week ago. Symptoms have resolved today. He has some good days and bad days. He does not know what causes the difference in how he feels. He has had epidural steroid shots for years. He has pain in both sides at different times. He has sensation of weakness and stiffness. Has been sedentary for the past few months because of neck pain.   PERTINENT HISTORY:  ACDF 07/31/23; first cervical fusion 2018; arthritis; AODM; HTN; anxiety; has had 14 surgeries - neck, shoulder, knee, ankle, elbow surgeries, appendectomy, gallbladder surgery, double hernia surgeries    PAIN:  Are you having pain? Yes: NPRS scale: 3/10 in neck  Pain location: cervical/thoracic spine to bilat upper traps  Pain description: soreness   Aggravating factors: use of UE's   Relieving factors: heating pad; ice; OTC meds; muscle relaxer    Are you having pain? Yes: NPRS scale: 2/10   Pain location: R LB Pain description: soreness; - at times posterior hip, thigh, calf to ankle  Aggravating factors: overuse   Relieving  factors: heating pad; OTC meds; muscle relaxer  PRECAUTIONS: Other: ACDF 07/29/23 - limited to gentle cervical movement; no lifting > 10 pounds   RED FLAGS: None   WEIGHT BEARING RESTRICTIONS: No  FALLS:  Has patient fallen in last 6 months? Yes. 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  DIAGNOSTIC FINDINGS:  MRi lumbar spine 03/11/23; 1. Progressive multilevel lumbar spondylosis as described above. 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 2. Worsened now moderate bilateral lateral recess stenosis and mild-to-moderate spinal canal stenosis at L4-L5.  PATIENT SURVEYS:  Modified Oswestry 30/50; 60% limitation  09/25/23: Quick DASH 31.8/100; 31.8%   COGNITION: Overall cognitive status: Within functional limits for tasks assessed     SENSATION: Peripherial neuropathy bilat LE's x 10 yrs   MUSCLE LENGTH: Hamstrings: Right 55 deg; Left 65 deg   POSTURE: rounded shoulders, forward head, decreased lumbar lordosis, increased thoracic kyphosis, weight shift left, and L LE shorter that R in standing    PALPATION: Tightness bilat hip flexors - hx of bilat hernia repair and bowel resection  CERVICAL ROM: stiffness with all cervical movements   Active ROM A/PROM (deg) eval  Flexion 46  Extension 13  Right lateral flexion 24  Left lateral flexion 14  Right rotation 39  Left rotation 23   (Blank rows = not tested)   UE ROM:  Shoulder  Flexion R - 140; L 140  Extension R - 74; L 60  Abduction R - 133; L 133  IR R - T11/12; T12/L1             ER R - 75; L 70  UE STRENGTH:  Bilat UE strength - grossly WFL not assessed resistively due to post op restrictions  Grip strength R 67#; L 65#   LUMBAR ROM:   AROM eval  Flexion 60% pull B back and thighs  Extension 30%   Right lateral flexion 70%   Left lateral flexion 55% tight R LB  Right rotation 50%  Left rotation 50%    (Blank rows = not tested)  LOWER EXTREMITY ROM:  end range tightness R > L hips - assessed in supine due to ACDF   Active  Right eval Left eval  Hip flexion    Hip extension    Hip abduction    Hip adduction    Hip internal rotation    Hip external rotation    Knee flexion    Knee extension    Ankle dorsiflexion    Ankle plantarflexion    Ankle inversion    Ankle eversion     (Blank rows = not tested)  LOWER EXTREMITY MMT:  assessed in supine due to ACDF  MMT Right eval Left eval  Hip flexion 4- 4  Hip extension 4- 4-  Hip abduction 4- 4-  Hip adduction    Hip internal rotation    Hip external rotation    Knee flexion    Knee extension    Ankle dorsiflexion    Ankle plantarflexion    Ankle inversion    Ankle eversion     (Blank rows = not tested)  LUMBAR SPECIAL TESTS:  Straight leg raise test: Negative and Slump test: Negative  FUNCTIONAL TESTS:  5 times sit to stand: 20.5 sec  SLS - unable to balance on either leg   09/25/23: 5 times sit to stand: 18.49 sec     SLS R 5 sec; L 2 sec  GAIT: Distance walked: 40 feet Assistive device utilized: None Level of assistance:  Complete Independence Comments: fwd flexed trunk; LE's in ER   [facility] Adult PT Treatment:                                                DATE: 09/30/2023 Therapeutic Exercise: Seated cervical AROM --> lateral flexion, flexion/extension Supine bridges 2x10 Seated bkwd shoulder circles  UT stretch with hand behind back Neuromuscular re-ed: Standing with back against noodle: Scap squeezes with chest lift Shoulder ER + YTB Supine: Shoulder horiz abd + RTB Shoulder flexion with dowel + noodle horizontal at upper-mid back --> tactile assist for cervical elongation Bent knee fall out + blue TB x15  each side Modified dead bug + orange PB braced b/w knees and hands (feet on mat) Seated abdominal bracing + press down on orange PB    [facility] Adult PT Treatment:                                                DATE: 09/27/2023 Therapeutic Exercise: Supine sciatic nerve glide  Seated sciatic tensioner Supine cervical rotation  Seated UT stretch --> active & passive Bridges  Manual Therapy: STM cervical paraspinals, cervical upglides, SO, SOR, UT/LS Neuromuscular re-ed: TA activation + breathing Diaphragmatic breathing + TA activation Bent knee fall out with GTB + ab bracing Supine: Cervical retraction with towel roll Shoulder horizontal abduction + RTB Standing scap squeezes with noodle Upright postural cues for chest lift and abdominal bracing --> added shoulder ER AROM   [facility] Adult PT Treatment:                                                DATE: 09/25/2023 Review evaluation findings and POC   Therapeutic exercise:  Chest lift with focus on thoracic extension  Chin tuck Continue with HEP from prior treatment sessions for LBP  PATIENT EDUCATION:  Education details: Updated HEP  Person educated: Patient Education method: Explanation, Demonstration, Tactile cues, Verbal cues, and  Handouts Education comprehension: verbalized understanding, returned demonstration, verbal cues required, tactile cues required, and needs further education  HOME EXERCISE PROGRAM: Access Code: [email or web address] URL: [email or web address] Date: 09/27/2023 Prepared by: Sims Duck  Exercises - Hooklying Hamstring Stretch with Strap  - 2 x daily - 7 x weekly - 1 sets - 3 reps - 30 sec  hold - Hooklying Single Knee to Chest Stretch  - 2 x daily - 7 x weekly - 1 sets - 3-5 reps - 10-15 sec  hold - Supine Sciatic Nerve Glide  - 1 x daily - 7 x weekly - 3 sets - 10 reps - Seated Sciatic Tensioner  - 1 x daily - 7 x weekly - 3 sets - 10 reps - Hooklying Single Leg Bent Knee Fallouts with Resistance  - 1 x daily - 7 x weekly - 3 sets - 10 reps - Supine Bridge with Resistance Band  - 1 x daily - 7 x weekly - 3 sets - 10 reps - Sidelying Hip Abduction  - 1 x daily - 7 x weekly - 3 sets - 10 reps - Sidelying Over and Back  - 1 x daily - 7 x weekly - 3 sets - 10 reps - Seated Upper Trap Stretch  - 3 x daily - 7 x weekly - 1 sets - 3-5 reps - 10-30 sec hold - Seated Scapular Retraction  - 1 x daily - 7 x weekly - 3 sets - 10 reps - 5 sec hold - Supine Cervical Retraction with Towel  - 1 x daily - 7 x weekly - 3 sets - 10 reps - 5 sec hold   ASSESSMENT:  CLINICAL IMPRESSION: Postural and LE strengthening exercises progressed as tolerated. Tactile assist provided to promote cervical elongation during thoracic extension exercise in supine. Frequent cueing required to address upper trapezius compensation and rounded forward posture.  RE-EVAL: Patient is a 68 yo male who presents following ACDF 07/31/23. [name] repors that he has improved since cervical surgery. He has better cervical and shoulder ROM and is using UEs for more functional activities. Per patient report, he has no restrictions for cervical or shoulder AROM. He has lifting restriction of no more than 10#. Patient presents with poor  cervical and thoracic posture and alignment; limited spinal mobility and ROM; decreased functional strength bilat UE's; decreased grip strength bilat; muscular tightness to palpation through the cervical and upper trap musculature; pain and stiffness in the cervical and lumbar spine; decreased balance. Patient will benefit from spinal stabilization and core strengthening; postural correction; U/LE strengthening; progression of functional activity level and endurance. Patient is an excellent candidate for physical therapy to address deficits identified.   EVAL: Patient is a 68 y.o. male who was seen today for physical therapy evaluation and treatment for low back pain with diagnosis of lumbar spondylosis. He had a ACDF for cervical cord compression with decreased strength due to compression 07/31/23 and remains on restrictions including gentle cervical ROM and no lifting. 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 He has been treated with chiropractic care and ESI injections. He presents with poor posture and alignment; limited trunk and LE mobility and ROM; decreased strength core and LE's; peripheral neuropathy bilat LE's; poor balance and history of frequent falls. Patient has intermittent LE pain in either LE. He  reports that he has a sedentary lifestyle and fatigues quickly with physical activities. He will benefit from PT to address areas of deficits identified.   OBJECTIVE IMPAIRMENTS: Abnormal gait, decreased activity tolerance, decreased balance, decreased mobility, decreased ROM, decreased strength, impaired flexibility, improper body mechanics, postural dysfunction, and pain.   ACTIVITY LIMITATIONS: carrying, lifting, bending, sitting, standing, squatting, sleeping, and stairs  PARTICIPATION LIMITATIONS: meal prep, cleaning, laundry, shopping, community activity, and yard work  PERSONAL FACTORS: Age, Behavior pattern, Fitness, Past/current experiences,  Time since onset of injury/illness/exacerbation, and comorbidities: AODM; peripheral neuropathy, sedentary lifestyle, weakness, decreased endurance are also affecting patient's functional outcome.   REHAB POTENTIAL: Good  CLINICAL DECISION MAKING: Evolving/moderate complexity  EVALUATION COMPLEXITY: Moderate   GOALS: Goals reviewed with patient? Yes  SHORT TERM GOALS: Target date: 10/23/2023  Independent in initial HEP  Baseline: Goal status: INITIAL  2.  Increase activity tolerance with patient tolerating 20 min of exercise without fatigue reported  Baseline:  Goal status: INITIAL  3.  Patient reports walking for grocery shopping without difficulty  Baseline:  Goal status: INITIAL   LONG TERM GOALS: Target date: 11/20/2023  Increase U/LE strength to 4+/5 to 5/5  Baseline:  Goal status: INITIAL  2.  Improve functional strength with patient demonstrating decrease in timed sit to stand by 3-5 sec  Baseline:  Goal status: INITIAL  3.  Improve activity tolerance with patient to tolerate 30-45 minutes of therapeutic activity and exercise with only 1-2 periods of 2-3 min rest to recovery  Baseline:  Goal status: INITIAL  4.  Independent in HEP including aquatic therapy as indicated  Baseline:  Goal status: INITIAL  5.  Improve Modified Owestry score by 10-15 points  Baseline: 30/50; 60%  Goal status: INITIAL   6. Increase cervical AROM in extension, lateral flexion, rotation by 5-7 degrees to improve functional activity - checking traffic when driving    Baseline:    Goal status; INITIAL    7. Decrease pain in the cervical and lumbar spine by 50-70% allowing patient to increase functional activity level    Baseline:    Goal status: INITIAL    8. Improve Single leg stance time to =/> 10 sec without UE support to improve balance and stability in standing   Baseline:   Goal status: INITIAL   9. Improve Quick DASH score by 10 points demonstrating improved UE function   Baseline:  31.8/100; 31.8% Goal status: INITIAL  PLAN:  PT FREQUENCY: 2x/week  PT DURATION: 8 weeks  PLANNED INTERVENTIONS: 97164- PT Re-evaluation, 97110-Therapeutic exercises, 97530- Therapeutic activity, 97112- Neuromuscular re-education, 97535- Self Care, 16109- Manual therapy, [phone]- Gait training, [phone]- Aquatic Therapy, Patient/Family education, Balance training, Stair training, Taping, Dry Needling, and Joint mobilization.  PLAN FOR NEXT SESSION:  Review and progress exercises; continue spine care education; manual work and modalities as indicated Add AROM cervical spine to HEP, bilat shoulders as tolerated; improve thoracic extension; add balance activities    Flint Hummer, PTA 09/30/2023, 2:45 PM

## 2023-10-02 ENCOUNTER — Ambulatory Visit

## 2023-10-02 DIAGNOSIS — R29898 Other symptoms and signs involving the musculoskeletal system: Secondary | ICD-10-CM | POA: Diagnosis not present

## 2023-10-02 DIAGNOSIS — M6281 Muscle weakness (generalized): Secondary | ICD-10-CM

## 2023-10-02 DIAGNOSIS — M47816 Spondylosis without myelopathy or radiculopathy, lumbar region: Secondary | ICD-10-CM

## 2023-10-02 DIAGNOSIS — M5442 Lumbago with sciatica, left side: Secondary | ICD-10-CM | POA: Diagnosis not present

## 2023-10-02 DIAGNOSIS — R2681 Unsteadiness on feet: Secondary | ICD-10-CM | POA: Diagnosis not present

## 2023-10-02 DIAGNOSIS — M5441 Lumbago with sciatica, right side: Secondary | ICD-10-CM | POA: Diagnosis not present

## 2023-10-02 DIAGNOSIS — M539 Dorsopathy, unspecified: Secondary | ICD-10-CM

## 2023-10-02 DIAGNOSIS — G8929 Other chronic pain: Secondary | ICD-10-CM | POA: Diagnosis not present

## 2023-10-02 NOTE — Therapy (Signed)
 OUTPATIENT PHYSICAL THERAPY THORACOLUMBAR TREATMENT   Patient Name: Marcus Brock MRN: [id number] DOB:05-17-1955, 68 y.o., male Today's Date: 10/02/2023  END OF SESSION:  PT End of Session - 10/02/23 1403     Visit Number 6    Number of Visits 16    Date for PT Re-Evaluation 11/20/23    Authorization Type [employer] medicare $10 copay    Progress Note Due on Visit 10    PT Start Time 1402    PT Stop Time 1447    PT Time Calculation (min) 45 min    Activity Tolerance Patient tolerated treatment well    Behavior During Therapy WFL for tasks assessed/performed            Past Medical History:  Diagnosis Date   Allergy 1980   Anxiety 1982   Arthritis 1995   Asthma    COPD (chronic obstructive pulmonary disease) (HCC) 2024   Depression 1982   Diabetes mellitus without complication (HCC)    Diabetic neuropathy (HCC)    feet   GERD (gastroesophageal reflux disease) 1992   History of hiatal hernia    Hypertension    Neuromuscular disorder (HCC)    Neuropathy bilateral arms from cervical spine   Pneumonia    Ulcer 1988   Past Surgical History:  Procedure Laterality Date   ANKLE SURGERY Left    As a child. Almost sliced in half   ANTERIOR CERVICAL DECOMP/DISCECTOMY FUSION N/A 07/31/2023   Procedure: ANTERIOR CERVICAL DECOMPRESSION/DISCECTOMY FUSION 1 LEVEL;  Surgeon: Virl Grimes, MD;  Location: [facility] OR;  Service: Orthopedics;  Laterality: N/A;  ANTERIOR CERVICAL DECOMPRESSION FUSION CERVICAL 3- CERVICAL 4 WITH INSTRUMENTATION AND ALLOGRAFT   APPENDECTOMY  01/2017   CHOLECYSTECTOMY  1995   COLON RESECTION N/A 04/2003   COLONOSCOPY  07/2002   12/2009; 12/2014   GANGLION CYST EXCISION Left 01/2002   left elbow   INGUINAL HERNIA REPAIR Right 2024   KNEE ARTHROSCOPY WITH MEDIAL MENISECTOMY Left 07/2017   NECK SURGERY  01/2014   NEVUS EXCISION     Back   ROTATOR CUFF REPAIR Right 12/2001   SPINE SURGERY  2018   Cervical Neck Fusion   TOOTH EXTRACTION     15 teeth removed  due to infection   VASECTOMY  1996   Patient Active Problem List   Diagnosis Date Noted   HTN, goal below 130/80 09/06/2023   Type 2 diabetes mellitus with diabetic neuropathy, without long-term current use of insulin  (HCC) 09/06/2023   Panic attack 05/28/2023   COPD exacerbation (HCC) 05/13/2023   Acute cough 05/13/2023   Primary osteoarthritis of both hands 05/02/2023   Primary osteoarthritis of both knees 05/02/2023   Polyarthralgia 04/03/2023   Cervical fusion syndrome 01/14/2023   Peripheral polyneuropathy 01/14/2023   Weak urinary stream 01/14/2023   Chronic obstructive pulmonary disease (HCC) 01/02/2023   Type 2 diabetes mellitus without complication, without long-term current use of insulin  (HCC) 12/05/2022   Lumbar spondylosis 12/05/2022   History of CAD (coronary artery disease) 12/05/2022   Mixed hyperlipidemia 12/05/2022   GAD (generalized anxiety disorder) 12/05/2022   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 episode (HCC) 12/05/2022   Gastroesophageal reflux disease without esophagitis 12/05/2022   Wheezing 12/05/2022   Bilateral inguinal hernia without obstruction or gangrene 05/15/2022   Anxiety 04/25/2022   S/P left knee arthroscopy 07/26/2017   Acute medial meniscus tear of left knee 07/03/2017   Perforated appendicitis 01/29/2017   Anterior basement membrane dystrophy 02/27/2016  Hematuria, microscopic 09/16/2012    PCP: Dr Josepha Nickels,  REFERRING PROVIDER: Dr Virl Grimes   Dr Annemarie Kil LBP  REFERRING DIAG: Neck pain with history of spinal surgery;  Lumbar spondylosis  Rationale for Evaluation and Treatment: Rehabilitation  THERAPY DIAG:  Other symptoms and signs involving the musculoskeletal system  Muscle weakness (generalized)  Cervical dysfunction  Unsteadiness  Chronic bilateral low back pain with sciatica, sciatica laterality unspecified  Lumbar spondylosis  ONSET DATE: 08/22/23 flare up of long  standing back pain  SUBJECTIVE:                                                                                                                                                                                           SUBJECTIVE STATEMENT: Patient reports his back felt better after last visit.   RE-EVAL; 09/25/23; patient reports that he had as second cervical disc fusion 07/29/23 to remove old hardware and fuse adjacent levels. First cervical fusion was C4/5 in February, 2018. [name] thinks he is now fused C3 - C5. Surgery was done on an outpatient basis. He has had some post op soreness and pain. He has arthritis in both arms and hands. He trimmed some bushes yesterday and had stiffness and tightness in R > L hand.    EVAL: Patient reports that he has has sciatica for 35 years. He had a flare up of R LB and R LE about a week ago. Symptoms have resolved today. He has some good days and bad days. He does not know what causes the difference in how he feels. He has had epidural steroid shots for years. He has pain in both sides at different times. He has sensation of weakness and stiffness. Has been sedentary for the past few months because of neck pain.   PERTINENT HISTORY:  ACDF 07/31/23; first cervical fusion 2018; arthritis; AODM; HTN; anxiety; has had 14 surgeries - neck, shoulder, knee, ankle, elbow surgeries, appendectomy, gallbladder surgery, double hernia surgeries    PAIN:  Are you having pain? Yes: NPRS scale: 3/10 in neck  Pain location: cervical/thoracic spine to bilat upper traps  Pain description: soreness   Aggravating factors: use of UE's   Relieving factors: heating pad; ice; OTC meds; muscle relaxer    Are you having pain? Yes: NPRS scale: 2/10   Pain location: R LB Pain description: soreness; - at times posterior hip, thigh, calf to ankle  Aggravating factors: overuse   Relieving factors: heating pad; OTC meds; muscle relaxer   PRECAUTIONS: Other: ACDF 07/29/23 - limited to  gentle cervical movement; no lifting > 10 pounds   RED FLAGS:  None   WEIGHT BEARING RESTRICTIONS: No  FALLS:  Has patient fallen in last 6 months? Yes. 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  DIAGNOSTIC FINDINGS:  MRi lumbar spine 03/11/23; 1. Progressive multilevel lumbar spondylosis as described above. 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 2. Worsened now moderate bilateral lateral recess stenosis and mild-to-moderate spinal canal stenosis at L4-L5.  PATIENT SURVEYS:  Modified Oswestry 30/50; 60% limitation  09/25/23: Quick DASH 31.8/100; 31.8%   COGNITION: Overall cognitive status: Within functional limits for tasks assessed     SENSATION: Peripherial neuropathy bilat LE's x 10 yrs   MUSCLE LENGTH: Hamstrings: Right 55 deg; Left 65 deg   POSTURE: rounded shoulders, forward head, decreased lumbar lordosis, increased thoracic kyphosis, weight shift left, and L LE shorter that R in standing   PALPATION: Tightness bilat hip flexors - hx of bilat hernia repair and bowel resection  CERVICAL ROM:  stiffness with all cervical movements   Active ROM A/PROM (deg) eval  Flexion 46  Extension 13  Right lateral flexion 24  Left lateral flexion 14  Right rotation 39  Left rotation 23   (Blank rows = not tested)   UE ROM:  Shoulder  Flexion R - 140; L 140  Extension R - 74; L 60  Abduction R - 133; L 133  IR R - T11/12; T12/L1             ER R - 75; L 70  UE STRENGTH:  Bilat UE strength - grossly WFL not assessed resistively due to post op restrictions  Grip strength R 67#; L 65#   LUMBAR ROM:   AROM eval  Flexion 60% pull B back and thighs  Extension 30%   Right lateral flexion 70%   Left lateral flexion 55% tight R LB  Right rotation 50%  Left rotation 50%    (Blank rows = not tested)  LOWER EXTREMITY ROM:  end range tightness R > L hips - assessed in supine due to ACDF   Active  Right eval Left eval  Hip flexion    Hip extension    Hip abduction    Hip adduction    Hip internal rotation    Hip external rotation    Knee flexion    Knee extension    Ankle dorsiflexion    Ankle plantarflexion    Ankle inversion    Ankle eversion     (Blank rows = not tested)  LOWER EXTREMITY MMT:  assessed in supine due to ACDF   MMT Right eval Left eval  Hip flexion 4- 4  Hip extension 4- 4-  Hip  abduction 4- 4-  Hip adduction    Hip internal rotation    Hip external rotation    Knee flexion    Knee extension    Ankle dorsiflexion    Ankle plantarflexion    Ankle inversion    Ankle eversion     (Blank rows = not tested)  LUMBAR SPECIAL TESTS:  Straight leg raise test: Negative and Slump test: Negative  FUNCTIONAL TESTS:  5 times sit to stand: 20.5 sec  SLS - unable to balance on either leg   09/25/23: 5 times sit to stand: 18.49 sec     SLS R 5 sec; L 2 sec  GAIT: Distance walked: 40 feet Assistive device utilized: None Level of assistance: Complete Independence Comments: fwd flexed trunk; LE's in ER   [facility] Adult PT Treatment:                                                 DATE: 10/02/2023 Therapeutic Exercise: Supine HS stretch with strap 2x30 (bil) Bridges + black TB for hip abd iso 2x10 Neuromuscular re-ed: Supine: Hip abd + isometric hold with Blue TB10x10 Supine cervical retraction with towel roll 10x5 Shoulder horiz abd + RTB Diagonal pull + RTB Modified dead bug + orange PB braced b/w knees and hands (feet on mat) Shoulder flexion with dowel + noodle horizontal at upper-mid back --> tactile assist for cervical elongation Reactive isometric row + GTB  Shoulder extension pull down + RTB Therapeutic Activity: Seated hip hinge with dowel Standing hip hinge dead lift with dowel   [facility] Adult PT Treatment:                                                DATE: 09/30/2023 Therapeutic Exercise: Seated cervical AROM --> lateral flexion, flexion/extension Supine bridges 2x10 Seated bkwd shoulder circles  UT stretch with hand behind back Neuromuscular re-ed: Standing with back against noodle: Scap squeezes with chest lift Shoulder ER + YTB Supine: Shoulder horiz abd + RTB Shoulder flexion with dowel + noodle horizontal at upper-mid back --> tactile assist for cervical elongation Bent knee fall out + blue TB x15  each side Modified dead bug + orange PB braced b/w knees and hands (feet on mat) Seated abdominal bracing + press down on orange PB    [facility] Adult PT Treatment:                                                DATE: 09/27/2023 Therapeutic Exercise: Supine sciatic nerve glide  Seated sciatic tensioner Supine cervical rotation  Seated UT stretch --> active & passive Bridges  Manual Therapy: STM cervical paraspinals, cervical upglides, SO, SOR, UT/LS Neuromuscular re-ed: TA activation + breathing Diaphragmatic breathing + TA activation Bent knee fall out with GTB + ab bracing Supine: Cervical retraction with towel roll Shoulder horizontal abduction + RTB Standing scap squeezes with noodle Upright postural cues  for chest lift and abdominal bracing --> added shoulder ER AROM  PATIENT EDUCATION:  Education details: Updated HEP  Person educated: Patient Education method: Explanation, Demonstration, Tactile cues, Verbal cues, and Handouts Education comprehension: verbalized understanding, returned demonstration, verbal cues required, tactile cues required, and needs further education  HOME EXERCISE PROGRAM: Access Code: [email or web address] URL: [email or web address] Date: 09/27/2023 Prepared by: Sims Duck  Exercises - Hooklying Hamstring Stretch with Strap  - 2 x daily - 7 x weekly - 1 sets - 3 reps - 30 sec  hold - Hooklying Single Knee to Chest Stretch  - 2 x daily - 7 x weekly - 1 sets - 3-5 reps - 10-15 sec  hold - Supine Sciatic Nerve Glide  - 1 x daily - 7 x weekly - 3 sets - 10 reps - Seated Sciatic Tensioner  - 1 x daily - 7 x weekly - 3 sets - 10 reps - Hooklying Single Leg Bent Knee Fallouts with Resistance  - 1 x daily - 7 x weekly - 3 sets - 10 reps - Supine Bridge with Resistance Band  - 1 x daily - 7 x weekly - 3 sets - 10 reps - Sidelying Hip Abduction  - 1 x daily - 7 x weekly - 3 sets - 10 reps - Sidelying Over and Back  - 1 x daily - 7 x weekly - 3 sets - 10 reps - Seated Upper Trap Stretch  - 3 x daily - 7 x weekly - 1 sets - 3-5 reps - 10-30 sec hold - Seated Scapular Retraction  - 1 x daily - 7 x weekly - 3 sets - 10 reps - 5 sec hold - Supine Cervical Retraction with Towel  - 1 x daily - 7 x weekly - 3 sets - 10 reps - 5 sec hold   ASSESSMENT:  CLINICAL IMPRESSION: Improved glute and TA activation noted during supine LE strengthening exercises. Hip hinge mechanics progressed form seated to standing dead lifts with dowel for postural cueing. Patient had difficulty maintaining postural cues during reactive scapula retraction isometrics and resisted shoulder extension,  requiring frequent cueing and tactile assist for balance.   RE-EVAL: Patient is a 68 yo male who presents following ACDF 07/31/23. [name] repors that he has improved since cervical surgery. He has better cervical and shoulder ROM and is using UEs for more functional activities. Per patient report, he has no restrictions for cervical or shoulder AROM. He has lifting restriction of no more than 10#. Patient presents with poor cervical and thoracic posture and alignment; limited spinal mobility and ROM; decreased functional strength bilat UE's; decreased grip strength bilat; muscular tightness to palpation through the cervical and upper trap musculature; pain and stiffness in the cervical and lumbar spine; decreased balance. Patient will benefit from spinal stabilization and core strengthening; postural correction; U/LE strengthening; progression of functional activity level and endurance. Patient is an excellent candidate for physical therapy to address deficits identified.   EVAL: Patient is a 68 y.o. male who was seen today for physical therapy evaluation and treatment for low back pain with diagnosis of lumbar spondylosis. He had a ACDF for cervical cord compression with decreased strength due to compression 07/31/23 and remains on restrictions including gentle cervical ROM and no lifting. 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 He has been treated with chiropractic care and ESI injections. He presents with poor posture and alignment; limited trunk and LE mobility and ROM; decreased strength core and LE's; peripheral neuropathy bilat LE's;  poor balance and history of frequent falls. Patient has intermittent LE pain in either LE. He reports that he has a sedentary lifestyle and fatigues quickly with physical activities. He will benefit from PT to address areas of deficits identified.   OBJECTIVE IMPAIRMENTS: Abnormal gait, decreased activity tolerance, decreased  balance, decreased mobility, decreased ROM, decreased strength, impaired flexibility, improper body mechanics, postural dysfunction, and pain.   ACTIVITY LIMITATIONS: carrying, lifting, bending, sitting, standing, squatting, sleeping, and stairs  PARTICIPATION LIMITATIONS: meal prep, cleaning, laundry, shopping, community activity, and yard work  PERSONAL FACTORS: Age, Behavior pattern, Fitness, Past/current experiences, Time since onset of injury/illness/exacerbation, and comorbidities: AODM; peripheral neuropathy, sedentary lifestyle, weakness, decreased endurance are also affecting patient's functional outcome.   REHAB POTENTIAL: Good  CLINICAL DECISION MAKING: Evolving/moderate complexity  EVALUATION COMPLEXITY: Moderate   GOALS: Goals reviewed with patient? Yes  SHORT TERM GOALS: Target date: 10/23/2023  Independent in initial HEP  Baseline: Goal status: INITIAL  2.  Increase activity tolerance with patient tolerating 20 min of exercise without fatigue reported  Baseline:  Goal status: INITIAL  3.  Patient reports walking for grocery shopping without difficulty  Baseline:  Goal status: INITIAL   LONG TERM GOALS: Target date: 11/20/2023  Increase U/LE strength to 4+/5 to 5/5  Baseline:  Goal status: INITIAL  2.  Improve functional strength with patient demonstrating decrease in timed sit to stand by 3-5 sec  Baseline:  Goal status: INITIAL  3.  Improve activity tolerance with patient to tolerate 30-45 minutes of therapeutic activity and exercise with only 1-2 periods of 2-3 min rest to recovery  Baseline:  Goal status: INITIAL  4.  Independent in HEP including aquatic therapy as indicated  Baseline:  Goal status: INITIAL  5.  Improve Modified Owestry score by 10-15 points  Baseline: 30/50; 60%  Goal status: INITIAL   6. Increase cervical AROM in extension, lateral flexion, rotation by 5-7 degrees to improve functional activity - checking traffic when driving     Baseline:    Goal status; INITIAL    7. Decrease pain in the cervical and lumbar spine by 50-70% allowing patient to increase functional activity level    Baseline:    Goal status: INITIAL    8. Improve Single leg stance time to =/> 10 sec without UE support to improve balance and stability in standing   Baseline:   Goal status: INITIAL   9. Improve Quick DASH score by 10 points demonstrating improved UE function  Baseline:  31.8/100; 31.8% Goal status: INITIAL  PLAN:  PT FREQUENCY: 2x/week  PT DURATION: 8 weeks  PLANNED INTERVENTIONS: 97164- PT Re-evaluation, 97110-Therapeutic exercises, 97530- Therapeutic activity, 97112- Neuromuscular re-education, 97535- Self Care, 16109- Manual therapy, [phone]- Gait training, [phone]- Aquatic Therapy, Patient/Family education, Balance training, Stair training, Taping, Dry Needling, and Joint mobilization.  PLAN FOR NEXT SESSION:  Review and progress exercises; continue spine care education; manual work and modalities as indicated Add AROM cervical spine to HEP, bilat shoulders; improve thoracic extension; progress balance activities    Flint Hummer, PTA 10/02/2023, 4:39 PM

## 2023-10-07 ENCOUNTER — Ambulatory Visit

## 2023-10-07 DIAGNOSIS — M539 Dorsopathy, unspecified: Secondary | ICD-10-CM | POA: Diagnosis not present

## 2023-10-07 DIAGNOSIS — R2681 Unsteadiness on feet: Secondary | ICD-10-CM

## 2023-10-07 DIAGNOSIS — M6281 Muscle weakness (generalized): Secondary | ICD-10-CM | POA: Diagnosis not present

## 2023-10-07 DIAGNOSIS — M5442 Lumbago with sciatica, left side: Secondary | ICD-10-CM

## 2023-10-07 DIAGNOSIS — M47816 Spondylosis without myelopathy or radiculopathy, lumbar region: Secondary | ICD-10-CM

## 2023-10-07 DIAGNOSIS — M5441 Lumbago with sciatica, right side: Secondary | ICD-10-CM | POA: Diagnosis not present

## 2023-10-07 DIAGNOSIS — G8929 Other chronic pain: Secondary | ICD-10-CM | POA: Diagnosis not present

## 2023-10-07 DIAGNOSIS — R29898 Other symptoms and signs involving the musculoskeletal system: Secondary | ICD-10-CM | POA: Diagnosis not present

## 2023-10-07 NOTE — Therapy (Signed)
 OUTPATIENT PHYSICAL THERAPY THORACOLUMBAR TREATMENT   Patient Name: Even Budlong MRN: [id number] DOB:08-22-1955, 68 y.o., male Today's Date: 10/07/2023  END OF SESSION:  PT End of Session - 10/07/23 1406     Visit Number 7    Number of Visits 16    Date for PT Re-Evaluation 11/20/23    Authorization Type [employer] medicare $10 copay    Progress Note Due on Visit 10    PT Start Time 1406    PT Stop Time 1448    PT Time Calculation (min) 42 min    Activity Tolerance Patient tolerated treatment well    Behavior During Therapy WFL for tasks assessed/performed         Past Medical History:  Diagnosis Date   Allergy 1980   Anxiety 1982   Arthritis 1995   Asthma    COPD (chronic obstructive pulmonary disease) (HCC) 2024   Depression 1982   Diabetes mellitus without complication (HCC)    Diabetic neuropathy (HCC)    feet   GERD (gastroesophageal reflux disease) 1992   History of hiatal hernia    Hypertension    Neuromuscular disorder (HCC)    Neuropathy bilateral arms from cervical spine   Pneumonia    Ulcer 1988   Past Surgical History:  Procedure Laterality Date   ANKLE SURGERY Left    As a child. Almost sliced in half   ANTERIOR CERVICAL DECOMP/DISCECTOMY FUSION N/A 07/31/2023   Procedure: ANTERIOR CERVICAL DECOMPRESSION/DISCECTOMY FUSION 1 LEVEL;  Surgeon: Virl Grimes, MD;  Location: [facility] OR;  Service: Orthopedics;  Laterality: N/A;  ANTERIOR CERVICAL DECOMPRESSION FUSION CERVICAL 3- CERVICAL 4 WITH INSTRUMENTATION AND ALLOGRAFT   APPENDECTOMY  01/2017   CHOLECYSTECTOMY  1995   COLON RESECTION N/A 04/2003   COLONOSCOPY  07/2002   12/2009; 12/2014   GANGLION CYST EXCISION Left 01/2002   left elbow   INGUINAL HERNIA REPAIR Right 2024   KNEE ARTHROSCOPY WITH MEDIAL MENISECTOMY Left 07/2017   NECK SURGERY  01/2014   NEVUS EXCISION     Back   ROTATOR CUFF REPAIR Right 12/2001   SPINE SURGERY  2018   Cervical Neck Fusion   TOOTH EXTRACTION     15 teeth removed due  to infection   VASECTOMY  1996   Patient Active Problem List   Diagnosis Date Noted   HTN, goal below 130/80 09/06/2023   Type 2 diabetes mellitus with diabetic neuropathy, without long-term current use of insulin  (HCC) 09/06/2023   Panic attack 05/28/2023   COPD exacerbation (HCC) 05/13/2023   Acute cough 05/13/2023   Primary osteoarthritis of both hands 05/02/2023   Primary osteoarthritis of both knees 05/02/2023   Polyarthralgia 04/03/2023   Cervical fusion syndrome 01/14/2023   Peripheral polyneuropathy 01/14/2023   Weak urinary stream 01/14/2023   Chronic obstructive pulmonary disease (HCC) 01/02/2023   Type 2 diabetes mellitus without complication, without long-term current use of insulin  (HCC) 12/05/2022   Lumbar spondylosis 12/05/2022   History of CAD (coronary artery disease) 12/05/2022   Mixed hyperlipidemia 12/05/2022   GAD (generalized anxiety disorder) 12/05/2022   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 episode (HCC) 12/05/2022   Gastroesophageal reflux disease without esophagitis 12/05/2022   Wheezing 12/05/2022   Bilateral inguinal hernia without obstruction or gangrene 05/15/2022   Anxiety 04/25/2022   S/P left knee arthroscopy 07/26/2017   Acute medial meniscus tear of left knee 07/03/2017   Perforated appendicitis 01/29/2017   Anterior basement membrane dystrophy 02/27/2016   Hematuria, microscopic 09/16/2012  PCP: Dr Josepha Nickels,  REFERRING PROVIDER: Dr Virl Grimes   Dr Annemarie Kil LBP  REFERRING DIAG: Neck pain with history of spinal surgery;  Lumbar spondylosis  Rationale for Evaluation and Treatment: Rehabilitation  THERAPY DIAG:  Other symptoms and signs involving the musculoskeletal system  Muscle weakness (generalized)  Cervical dysfunction  Unsteadiness  Chronic bilateral low back pain with sciatica, sciatica laterality unspecified  Lumbar spondylosis  ONSET DATE: 08/22/23 flare up of long  standing back pain  SUBJECTIVE:                                                                                                                                                                                           SUBJECTIVE STATEMENT: Patient reports his neck and Lt shoulder feel tight today; states his ow back is better and overall he is feeling stronger.   RE-EVAL; 09/25/23; patient reports that he had as second cervical disc fusion 07/29/23 to remove old hardware and fuse adjacent levels. First cervical fusion was C4/5 in February, 2018. [name] thinks he is now fused C3 - C5. Surgery was done on an outpatient basis. He has had some post op soreness and pain. He has arthritis in both arms and hands. He trimmed some bushes yesterday and had stiffness and tightness in R > L hand.    EVAL: Patient reports that he has has sciatica for 35 years. He had a flare up of R LB and R LE about a week ago. Symptoms have resolved today. He has some good days and bad days. He does not know what causes the difference in how he feels. He has had epidural steroid shots for years. He has pain in both sides at different times. He has sensation of weakness and stiffness. Has been sedentary for the past few months because of neck pain.   PERTINENT HISTORY:  ACDF 07/31/23; first cervical fusion 2018; arthritis; AODM; HTN; anxiety; has had 14 surgeries - neck, shoulder, knee, ankle, elbow surgeries, appendectomy, gallbladder surgery, double hernia surgeries    PAIN:  Are you having pain? Yes: NPRS scale: 3/10 in neck  Pain location: cervical/thoracic spine to bilat upper traps  Pain description: soreness   Aggravating factors: use of UE's   Relieving factors: heating pad; ice; OTC meds; muscle relaxer    Are you having pain? Yes: NPRS scale: 2/10   Pain location: R LB Pain description: soreness; - at times posterior hip, thigh, calf to ankle  Aggravating factors: overuse   Relieving factors: heating pad; OTC meds;  muscle relaxer   PRECAUTIONS: Other: ACDF 07/29/23 - limited to gentle cervical movement; no lifting >  10 pounds   RED FLAGS: None   WEIGHT BEARING RESTRICTIONS: No  FALLS:  Has patient fallen in last 6 months? Yes. 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  DIAGNOSTIC FINDINGS:  MRi lumbar spine 03/11/23; 1. Progressive multilevel lumbar spondylosis as described above. 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 2. Worsened now moderate bilateral lateral recess stenosis and mild-to-moderate spinal canal stenosis at L4-L5.  PATIENT SURVEYS:  Modified Oswestry 30/50; 60% limitation  09/25/23: Quick DASH 31.8/100; 31.8%   COGNITION: Overall cognitive status: Within functional limits for tasks assessed     SENSATION: Peripherial neuropathy bilat LE's x 10 yrs   MUSCLE LENGTH: Hamstrings: Right 55 deg; Left 65 deg   POSTURE: rounded shoulders, forward head, decreased lumbar lordosis, increased thoracic kyphosis, weight shift left, and L LE shorter that R in standing   PALPATION: Tightness bilat hip flexors - hx of  bilat hernia repair and bowel resection  CERVICAL ROM: stiffness with all cervical movements   Active ROM A/PROM (deg) eval  Flexion 46  Extension 13  Right lateral flexion 24  Left lateral flexion 14  Right rotation 39  Left rotation 23   (Blank rows = not tested)   UE ROM:  Shoulder  Flexion R - 140; L 140  Extension R - 74; L 60  Abduction R - 133; L 133  IR R - T11/12; T12/L1             ER R - 75; L 70  UE STRENGTH:  Bilat UE strength - grossly WFL not assessed resistively due to post op restrictions  Grip strength R 67#; L 65#   LUMBAR ROM:   AROM eval  Flexion 60% pull B back and thighs  Extension 30%   Right lateral flexion 70%   Left lateral flexion 55% tight R LB  Right rotation 50%  Left rotation 50%    (Blank rows = not tested)  LOWER EXTREMITY ROM:  end range tightness R > L hips - assessed in supine due to ACDF   Active  Right eval Left eval  Hip flexion    Hip extension    Hip abduction    Hip adduction    Hip internal rotation    Hip external rotation    Knee flexion    Knee extension    Ankle dorsiflexion    Ankle plantarflexion    Ankle inversion    Ankle eversion     (Blank rows = not tested)  LOWER EXTREMITY MMT:  assessed in supine due to ACDF   MMT Right eval Left eval  Hip flexion 4- 4  Hip extension 4- 4-  Hip abduction 4- 4-  Hip adduction    Hip internal rotation    Hip external rotation    Knee flexion    Knee extension    Ankle dorsiflexion    Ankle plantarflexion    Ankle inversion    Ankle eversion     (Blank rows = not tested)  LUMBAR SPECIAL TESTS:  Straight leg raise test: Negative and Slump test: Negative  FUNCTIONAL TESTS:  5 times sit to stand: 20.5 sec  SLS - unable to balance on either leg   09/25/23: 5 times sit to stand: 18.49 sec     SLS R 5 sec; L 2 sec  GAIT: Distance walked: 40 feet Assistive device utilized: None Level of assistance: Complete Independence Comments: fwd flexed trunk;  LE's in ER   [facility] Adult PT Treatment:                                                DATE: 10/07/2023 Therapeutic Exercise: Shoulder flexion stretch at counter 10x10 3-way doorway pec stretch 3x30 S/L open books --> hand behind head Neuromuscular re-ed: Standing with noodle: Shoulder ER + scapula retraction Arm raises + 1#DB x10 each --> scaption, abduction, flexion Shoulder extension to neutral + RTB Staggered stance shoulder flexion scoop forward + TA activation with GTB Seated thoracic extension stretch over yoga mat roll + pillowcase to support neutral cervical alignment   [facility] Adult PT Treatment:                                                DATE: 10/02/2023 Therapeutic Exercise: Supine HS stretch with strap 2x30 (bil) Bridges + black TB for hip abd iso 2x10 Neuromuscular re-ed: Supine: Hip abd + isometric hold with Blue TB10x10 Supine cervical retraction with towel roll 10x5 Shoulder horiz abd + RTB Diagonal pull + RTB Modified dead bug + orange PB braced b/w knees and hands (feet on mat) Shoulder flexion with dowel + noodle horizontal at upper-mid back --> tactile assist for cervical elongation Reactive isometric row + GTB  Shoulder extension pull down + RTB Therapeutic Activity: Seated hip hinge with dowel Standing hip hinge dead lift with dowel   [facility] Adult PT Treatment:                                                DATE: 09/30/2023 Therapeutic Exercise: Seated cervical AROM --> lateral flexion, flexion/extension Supine bridges 2x10 Seated bkwd shoulder circles  UT stretch with hand behind back Neuromuscular re-ed: Standing with back against noodle: Scap squeezes with chest lift Shoulder ER + YTB Supine: Shoulder horiz abd + RTB Shoulder flexion with dowel + noodle horizontal at upper-mid back --> tactile assist for cervical elongation Bent knee fall out + blue TB x15  each side Modified dead bug + orange PB braced b/w knees and hands (feet on  mat) Seated abdominal bracing + press down on orange PB  PATIENT EDUCATION:  Education details: Updated HEP  Person educated: Patient Education method: Explanation, Demonstration, Tactile cues, Verbal cues, and Handouts Education comprehension: verbalized understanding, returned demonstration, verbal cues required, tactile cues required, and needs further education  HOME EXERCISE PROGRAM: Access Code: [email or web address] URL: [email or web address] Date: 10/07/2023 Prepared by: Sims Duck  Exercises - Hooklying Hamstring Stretch with Strap  - 2 x daily - 7 x weekly - 1 sets - 3 reps - 30 sec  hold - Hooklying Single Knee to Chest Stretch  - 2 x daily - 7 x weekly - 1 sets - 3-5 reps - 10-15 sec  hold - Supine Sciatic Nerve Glide  - 1 x daily - 7 x weekly - 3 sets - 10 reps - Seated Sciatic Tensioner  - 1 x daily - 7 x weekly - 3 sets - 10 reps - Hooklying Single Leg Bent Knee Fallouts with Resistance  - 1 x daily - 7 x weekly - 3 sets - 10 reps - Supine Bridge with Resistance Band  - 1 x daily - 7 x weekly - 3 sets - 10 reps - Sidelying Hip Abduction  - 1 x daily - 7 x weekly - 3 sets - 10 reps - Sidelying Over and Back  - 1 x daily - 7 x weekly - 3 sets - 10 reps - Seated Upper Trap Stretch  - 3 x daily - 7 x weekly - 1 sets - 3-5 reps - 10-30 sec hold - Seated Scapular Retraction  - 1 x daily - 7 x weekly - 3 sets - 10 reps - 5 sec hold - Supine Cervical Retraction with Towel  - 1 x daily - 7 x weekly - 3 sets - 10 reps - 5 sec hold - Standing 'L' Stretch at Counter  - 1 x daily - 7 x weekly - 3 sets - 10 reps - Doorway Pec Stretch at 60 Elevation  - 2 x daily - 7 x weekly - 1 sets - 3 reps - 30 sec hold - Doorway Pec Stretch at 90 Degrees Abduction  - 2 x daily - 7 x weekly - 1 sets - 3 reps - 30 sec hold - Doorway Pec Stretch at 120 Degrees Abduction  - 2 x daily - 7 x weekly -  1 sets - 3 reps - 30 sec hold - Sidelying Open Book  - 1 x daily - 7 x weekly - 3 sets - 10 reps   ASSESSMENT:  CLINICAL IMPRESSION: Session focused on postural exercises based on subjective intake. Patient continues to require tactile cues for postural alignment and maintain abdominal bracing mechanics. Improved cervical AROM in all directions demonstrated at end of session.    RE-EVAL: Patient is a 69 yo male who presents following ACDF 07/31/23. [name] repors that he has improved since cervical surgery. He has better cervical and shoulder ROM and is using UEs for more functional activities. Per patient report, he has no restrictions for cervical or shoulder AROM. He has lifting restriction of no more than 10#. Patient presents with poor cervical and thoracic posture and alignment; limited spinal mobility and ROM; decreased functional strength bilat UE's; decreased grip strength bilat; muscular tightness to palpation through the cervical and upper trap musculature; pain and stiffness in the cervical and lumbar spine; decreased balance. Patient will benefit from spinal stabilization and core strengthening; postural correction; U/LE strengthening; progression of functional activity level and endurance. Patient is an excellent candidate for physical therapy to address deficits  identified.   EVAL: Patient is a 68 y.o. male who was seen today for physical therapy evaluation and treatment for low back pain with diagnosis of lumbar spondylosis. He had a ACDF for cervical cord compression with decreased strength due to compression 07/31/23 and remains on restrictions including gentle cervical ROM and no lifting. 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 He has been treated with chiropractic care and ESI injections. He presents with poor posture and alignment; limited trunk and LE mobility and ROM; decreased strength core and LE's; peripheral neuropathy bilat LE's; poor  balance and history of frequent falls. Patient has intermittent LE pain in either LE. He reports that he has a sedentary lifestyle and fatigues quickly with physical activities. He will benefit from PT to address areas of deficits identified.   OBJECTIVE IMPAIRMENTS: Abnormal gait, decreased activity tolerance, decreased balance, decreased mobility, decreased ROM, decreased strength, impaired flexibility, improper body mechanics, postural dysfunction, and pain.   ACTIVITY LIMITATIONS: carrying, lifting, bending, sitting, standing, squatting, sleeping, and stairs  PARTICIPATION LIMITATIONS: meal prep, cleaning, laundry, shopping, community activity, and yard work  PERSONAL FACTORS: Age, Behavior pattern, Fitness, Past/current experiences, Time since onset of injury/illness/exacerbation, and comorbidities: AODM; peripheral neuropathy, sedentary lifestyle, weakness, decreased endurance are also affecting patient's functional outcome.   REHAB POTENTIAL: Good  CLINICAL DECISION MAKING: Evolving/moderate complexity  EVALUATION COMPLEXITY: Moderate   GOALS: Goals reviewed with patient? Yes  SHORT TERM GOALS: Target date: 10/23/2023  Independent in initial HEP  Baseline: Goal status: INITIAL  2.  Increase activity tolerance with patient tolerating 20 min of exercise without fatigue reported  Baseline:  Goal status: INITIAL  3.  Patient reports walking for grocery shopping without difficulty  Baseline:  Goal status: INITIAL   LONG TERM GOALS: Target date: 11/20/2023  Increase U/LE strength to 4+/5 to 5/5  Baseline:  Goal status: INITIAL  2.  Improve functional strength with patient demonstrating decrease in timed sit to stand by 3-5 sec  Baseline:  Goal status: INITIAL  3.  Improve activity tolerance with patient to tolerate 30-45 minutes of therapeutic activity and exercise with only 1-2 periods of 2-3 min rest to recovery  Baseline:  Goal status: INITIAL  4.  Independent in HEP  including aquatic therapy as indicated  Baseline:  Goal status: INITIAL  5.  Improve Modified Owestry score by 10-15 points  Baseline: 30/50; 60%  Goal status: INITIAL   6. Increase cervical AROM in extension, lateral flexion, rotation by 5-7 degrees to improve functional activity - checking traffic when driving    Baseline:    Goal status; INITIAL    7. Decrease pain in the cervical and lumbar spine by 50-70% allowing patient to increase functional activity level    Baseline:    Goal status: INITIAL    8. Improve Single leg stance time to =/> 10 sec without UE support to improve balance and stability in standing   Baseline:   Goal status: INITIAL   9. Improve Quick DASH score by 10 points demonstrating improved UE function  Baseline:  31.8/100; 31.8% Goal status: INITIAL  PLAN:  PT FREQUENCY: 2x/week  PT DURATION: 8 weeks  PLANNED INTERVENTIONS: 97164- PT Re-evaluation, 97110-Therapeutic exercises, 97530- Therapeutic activity, 97112- Neuromuscular re-education, 97535- Self Care, 09811- Manual therapy, [phone]- Gait training, [phone]- Aquatic Therapy, Patient/Family education, Balance training, Stair training, Taping, Dry Needling, and Joint mobilization.  PLAN FOR NEXT SESSION:  Review and progress exercises; continue  spine care education; manual work and modalities as indicated Improve thoracic extension; progress balance activities    Flint Hummer, PTA 10/07/2023, 2:50 PM

## 2023-10-08 ENCOUNTER — Ambulatory Visit (INDEPENDENT_AMBULATORY_CARE_PROVIDER_SITE_OTHER): Admitting: Sports Medicine

## 2023-10-08 ENCOUNTER — Other Ambulatory Visit (INDEPENDENT_AMBULATORY_CARE_PROVIDER_SITE_OTHER)

## 2023-10-08 DIAGNOSIS — M19042 Primary osteoarthritis, left hand: Secondary | ICD-10-CM | POA: Diagnosis not present

## 2023-10-08 DIAGNOSIS — M19041 Primary osteoarthritis, right hand: Secondary | ICD-10-CM | POA: Diagnosis not present

## 2023-10-08 DIAGNOSIS — M47816 Spondylosis without myelopathy or radiculopathy, lumbar region: Secondary | ICD-10-CM

## 2023-10-08 MED ORDER — TRIAMCINOLONE ACETONIDE 40 MG/ML IJ SUSP
40.0000 mg | Freq: Once | INTRAMUSCULAR | Status: AC
  Administered 2023-10-08: 40 mg via INTRA_ARTICULAR

## 2023-10-08 NOTE — Progress Notes (Signed)
    Procedures performed today:    Procedure: Real-time Ultrasound Guided injection of the right first [facility] Device: Samsung HS60  Verbal informed consent obtained.  Time-out conducted.  Noted no overlying erythema, induration, or other signs of local infection.  Skin prepped in a sterile fashion.  Local anesthesia: Topical Ethyl chloride.  With sterile technique and under real time ultrasound guidance: Arthritic joint noted, 0.5 cc lidocaine , 0.5 cc kenalog 40 injected easily. Completed without difficulty  Advised to call if fevers/chills, erythema, induration, drainage, or persistent bleeding.  Images permanently stored and available for review in PACS.  Impression: Technically successful ultrasound guided injection.  Procedure: Real-time Ultrasound Guided injection of the right first MCP Device: Samsung HS60  Verbal informed consent obtained.  Time-out conducted.  Noted no overlying erythema, induration, or other signs of local infection.  Skin prepped in a sterile fashion.  Local anesthesia: Topical Ethyl chloride.  With sterile technique and under real time ultrasound guidance: Arthritic joint noted, 0.5 cc lidocaine , 0.5 cc kenalog 40 injected easily. Completed without difficulty  Advised to call if fevers/chills, erythema, induration, drainage, or persistent bleeding.  Images permanently stored and available for review in PACS.  Impression: Technically successful ultrasound guided injection.  Independent interpretation of notes and tests performed by another provider:   None.  Brief History, Exam, Impression, and Recommendations:    Primary osteoarthritis of both hands Marcus Brock returns, he has bilateral hand pain, he has had multiple orthopedic treatments in the past, he is status post ACDF, neck is doing better, he still does have some bilateral radicular symptoms but improving with therapy. He did have some severe pain both hands right worse than left mostly at the first [facility]  and first MCP. We tried compression gloves, topical Voltaren, icing, nothing is working, today we did injections into the first [facility] and the first MCP on the right, return to see me 6 weeks.  Lumbar spondylosis Multifactorial low back pain with multilevel lumbar spondylosis, he has had epidurals in the past that provided very good relief, currently gabapentin  1200 mg twice daily, excessive sedation at 3 times daily. He continues with physical therapy and his back is doing a lot better. If this worsens we can certainly consider another epidural.    ____________________________________________ Joselyn Nicely. Sandy Crumb, M.D., ABFM., CAQSM., AME. Primary Care and Sports Medicine [facility]  Adjunct Professor of [employer] Medicine  [employer]  Restaurant manager, fast food

## 2023-10-08 NOTE — Addendum Note (Signed)
 Addended by: Montgomery Apgar on: 10/08/2023 12:02 PM   Modules accepted: Orders

## 2023-10-08 NOTE — Assessment & Plan Note (Signed)
 Marcus Brock returns, he has bilateral hand pain, he has had multiple orthopedic treatments in the past, he is status post ACDF, neck is doing better, he still does have some bilateral radicular symptoms but improving with therapy. He did have some severe pain both hands right worse than left mostly at the first [facility] and first MCP. We tried compression gloves, topical Voltaren, icing, nothing is working, today we did injections into the first [facility] and the first MCP on the right, return to see me 6 weeks.

## 2023-10-08 NOTE — Assessment & Plan Note (Signed)
 Multifactorial low back pain with multilevel lumbar spondylosis, he has had epidurals in the past that provided very good relief, currently gabapentin  1200 mg twice daily, excessive sedation at 3 times daily. He continues with physical therapy and his back is doing a lot better. If this worsens we can certainly consider another epidural.

## 2023-10-09 ENCOUNTER — Encounter: Payer: Self-pay | Admitting: Rehabilitative and Restorative Service Providers"

## 2023-10-09 ENCOUNTER — Ambulatory Visit: Admitting: Rehabilitative and Restorative Service Providers"

## 2023-10-09 DIAGNOSIS — R29898 Other symptoms and signs involving the musculoskeletal system: Secondary | ICD-10-CM

## 2023-10-09 DIAGNOSIS — R2681 Unsteadiness on feet: Secondary | ICD-10-CM | POA: Diagnosis not present

## 2023-10-09 DIAGNOSIS — M47816 Spondylosis without myelopathy or radiculopathy, lumbar region: Secondary | ICD-10-CM | POA: Diagnosis not present

## 2023-10-09 DIAGNOSIS — M6281 Muscle weakness (generalized): Secondary | ICD-10-CM

## 2023-10-09 DIAGNOSIS — G8929 Other chronic pain: Secondary | ICD-10-CM | POA: Diagnosis not present

## 2023-10-09 DIAGNOSIS — M539 Dorsopathy, unspecified: Secondary | ICD-10-CM

## 2023-10-09 DIAGNOSIS — M5442 Lumbago with sciatica, left side: Secondary | ICD-10-CM | POA: Diagnosis not present

## 2023-10-09 DIAGNOSIS — M5441 Lumbago with sciatica, right side: Secondary | ICD-10-CM | POA: Diagnosis not present

## 2023-10-09 NOTE — Therapy (Signed)
 OUTPATIENT PHYSICAL THERAPY THORACOLUMBAR TREATMENT   Patient Name: Marcus Brock MRN: [id number] DOB:[birth year], 68 y.o., male Today's Date: 10/09/2023  END OF SESSION:  PT End of Session - 10/09/23 1451     Visit Number 8    Number of Visits 16    Date for PT Re-Evaluation 11/20/23    Authorization Type [employer] medicare $10 copay    Progress Note Due on Visit 10    PT Start Time 1445    PT Stop Time 1530    PT Time Calculation (min) 45 min         Past Medical History:  Diagnosis Date   Allergy 1980   Anxiety 1982   Arthritis 1995   Asthma    COPD (chronic obstructive pulmonary disease) (HCC) 2024   Depression 1982   Diabetes mellitus without complication (HCC)    Diabetic neuropathy (HCC)    feet   GERD (gastroesophageal reflux disease) 1992   History of hiatal hernia    Hypertension    Neuromuscular disorder (HCC)    Neuropathy bilateral arms from cervical spine   Pneumonia    Ulcer 1988   Past Surgical History:  Procedure Laterality Date   ANKLE SURGERY Left    As a child. Almost sliced in half   ANTERIOR CERVICAL DECOMP/DISCECTOMY FUSION N/A 07/31/2023   Procedure: ANTERIOR CERVICAL DECOMPRESSION/DISCECTOMY FUSION 1 LEVEL;  Surgeon: Virl Grimes, MD;  Location: [facility] OR;  Service: Orthopedics;  Laterality: N/A;  ANTERIOR CERVICAL DECOMPRESSION FUSION CERVICAL 3- CERVICAL 4 WITH INSTRUMENTATION AND ALLOGRAFT   APPENDECTOMY  01/2017   CHOLECYSTECTOMY  1995   COLON RESECTION N/A 04/2003   COLONOSCOPY  07/2002   12/2009; 12/2014   GANGLION CYST EXCISION Left 01/2002   left elbow   INGUINAL HERNIA REPAIR Right 2024   KNEE ARTHROSCOPY WITH MEDIAL MENISECTOMY Left 07/2017   NECK SURGERY  01/2014   NEVUS EXCISION     Back   ROTATOR CUFF REPAIR Right 12/2001   SPINE SURGERY  2018   Cervical Neck Fusion   TOOTH EXTRACTION     15 teeth removed due to infection   VASECTOMY  1996   Patient Active Problem List   Diagnosis Date Noted   HTN, goal below 130/80  09/06/2023   Type 2 diabetes mellitus with diabetic neuropathy, without long-term current use of insulin  (HCC) 09/06/2023   Panic attack 05/28/2023   COPD exacerbation (HCC) 05/13/2023   Acute cough 05/13/2023   Primary osteoarthritis of both hands 05/02/2023   Primary osteoarthritis of both knees 05/02/2023   Polyarthralgia 04/03/2023   Cervical fusion syndrome 01/14/2023   Peripheral polyneuropathy 01/14/2023   Weak urinary stream 01/14/2023   Chronic obstructive pulmonary disease (HCC) 01/02/2023   Type 2 diabetes mellitus without complication, without long-term current use of insulin  (HCC) 12/05/2022   Lumbar spondylosis 12/05/2022   History of CAD (coronary artery disease) 12/05/2022   Mixed hyperlipidemia 12/05/2022   GAD (generalized anxiety disorder) 12/05/2022   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 episode (HCC) 12/05/2022   Gastroesophageal reflux disease without esophagitis 12/05/2022   Wheezing 12/05/2022   Bilateral inguinal hernia without obstruction or gangrene 05/15/2022   Anxiety 04/25/2022   S/P left knee arthroscopy 07/26/2017   Acute medial meniscus tear of left knee 07/03/2017   Perforated appendicitis 01/29/2017   Anterior basement membrane dystrophy 02/27/2016   Hematuria, microscopic 09/16/2012    PCP: Dr Marliss Simple Reina Cara,  REFERRING PROVIDER: Dr Virl Grimes   Dr Annemarie Kil  LBP  REFERRING DIAG: Neck pain with history of spinal surgery;  Lumbar spondylosis  Rationale for Evaluation and Treatment: Rehabilitation  THERAPY DIAG:  Other symptoms and signs involving the musculoskeletal system  Muscle weakness (generalized)  Cervical dysfunction  Unsteadiness  Chronic bilateral low back pain with sciatica, sciatica laterality unspecified  Lumbar spondylosis  ONSET DATE: 08/22/23 flare up of long standing back pain  SUBJECTIVE:                                                                                                                                                                                            SUBJECTIVE STATEMENT: Patient reports his neck and Lt shoulder are feeling a lot better. He has not had a flare up of sciatica in the past 3 visits. His low back is better and overall he is feeling stronger. Saw Dr. Elva Hamburger yesterday and got injections in the R wrist and thumb  RE-EVAL; 09/25/23; patient reports that he had as second cervical disc fusion 07/29/23 to remove old hardware and fuse adjacent levels. First cervical fusion was C4/5 in February, 2018. [name] thinks he is now fused C3 - C5. Surgery was done on an outpatient basis. He has had some post op soreness and pain. He has arthritis in both arms and hands. He trimmed some bushes yesterday and had stiffness and tightness in R > L hand.    EVAL: Patient reports that he has has sciatica for 35 years. He had a flare up of R LB and R LE about a week ago. Symptoms have resolved today. He has some good days and bad days. He does not know what causes the difference in how he feels. He has had epidural steroid shots for years. He has pain in both sides at different times. He has sensation of weakness and stiffness. Has been sedentary for the past few months because of neck pain.   PERTINENT HISTORY:  ACDF 07/31/23; first cervical fusion 2018; arthritis; AODM; HTN; anxiety; has had 14 surgeries - neck, shoulder, knee, ankle, elbow surgeries, appendectomy, gallbladder surgery, double hernia surgeries    PAIN:  Are you having pain? Yes: NPRS scale: 0/10 in neck  Pain location: cervical/thoracic spine to bilat upper traps  Pain description: soreness   Aggravating factors: use of UE's   Relieving factors: heating pad; ice; OTC meds; muscle relaxer    Are you having pain? Yes: NPRS scale: 0/10   Pain location: R LB Pain description: soreness; - at times posterior hip, thigh, calf to ankle  Aggravating factors: overuse   Relieving factors: heating pad; OTC  meds; muscle relaxer   PRECAUTIONS:  Other: ACDF 07/29/23 - limited to gentle cervical movement; no lifting > 10 pounds    WEIGHT BEARING RESTRICTIONS: No  FALLS:  Has patient fallen in last 6 months? Yes. 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  DIAGNOSTIC FINDINGS:  MRi lumbar spine 03/11/23; 1. Progressive multilevel lumbar spondylosis as described above. 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 2. Worsened now moderate bilateral lateral recess stenosis and mild-to-moderate spinal canal stenosis at L4-L5.  PATIENT SURVEYS:  Modified Oswestry 30/50; 60% limitation  09/25/23: Quick DASH 31.8/100; 31.8%   COGNITION: Overall cognitive status: Within functional limits for tasks assessed     SENSATION: Peripherial neuropathy bilat LE's x 10 yrs   MUSCLE LENGTH: Hamstrings: Right 55 deg; Left 65 deg   POSTURE: rounded shoulders, forward head, decreased lumbar lordosis, increased thoracic kyphosis, weight shift left, and L LE shorter that R in standing   PALPATION: Tightness bilat hip flexors - hx of bilat hernia repair and bowel  resection  CERVICAL ROM: stiffness with all cervical movements   Active ROM A/PROM (deg) eval AROM Deg 10/09/23  Flexion 46 51  Extension 13 25  Right lateral flexion 24 24  Left lateral flexion 14 15  Right rotation 39 39  Left rotation 23 27   (Blank rows = not tested)   UE ROM:  Shoulder  Flexion R - 140; L 140  Extension R - 74; L 60  Abduction R - 133; L 133  IR R - T11/12; T12/L1             ER R - 75; L 70  UE STRENGTH:  Bilat UE strength - grossly WFL not assessed resistively due to post op restrictions  Grip strength R 67#; L 65#   LUMBAR ROM:   AROM eval 10/09/23  Flexion 60% pull B back and thighs 75% pull in hamstrings   Extension 30%  40%  Right lateral flexion 70%  85%  Left lateral flexion 55% tight R LB 60% tight   Right rotation 50% 60%  Left rotation 50%  60%   (Blank rows = not tested)  LOWER EXTREMITY ROM:  end range tightness R > L hips - assessed in supine due to ACDF   Active  Right eval Left eval  Hip flexion    Hip extension    Hip abduction    Hip adduction    Hip internal rotation    Hip external rotation    Knee flexion    Knee extension    Ankle dorsiflexion    Ankle plantarflexion    Ankle inversion    Ankle eversion     (Blank rows =  not tested)  LOWER EXTREMITY MMT:  assessed in supine due to ACDF   MMT Right eval Left eval  Hip flexion 4- 4  Hip extension 4- 4-  Hip abduction 4- 4-  Hip adduction    Hip internal rotation    Hip external rotation    Knee flexion    Knee extension    Ankle dorsiflexion    Ankle plantarflexion    Ankle inversion    Ankle eversion     (Blank rows = not tested)  LUMBAR SPECIAL TESTS:  Straight leg raise test: Negative and Slump test: Negative  FUNCTIONAL TESTS:  5 times sit to stand: 20.5 sec  SLS - unable to balance on either leg   09/25/23: 5 times sit to stand: 18.49 sec     SLS R 5 sec; L 2 sec  GAIT: Distance walked: 40 feet Assistive device utilized: None Level of  assistance: Complete Independence Comments: fwd flexed trunk; LE's in ER   [facility] Adult PT Treatment:                                                DATE: 10/09/2023 Therapeutic Exercise: Shoulder flexion stretch at counter 10x10 3-way doorway pec stretch 3x30 S/L open books --> hand behind head x 10 R/L  Neuromuscular re-ed: Standing with noodle: Shoulder ER + scapula retraction Arm raises + 1#DB x10 each --> scaption, abduction, flexion Supine cervical retraction with towel roll 10x5 Supine shoulder horiz abd + RTB x 10  Supine sash + RTB R/L x 10  Shoulder flexion + RTB x 10  Seated  Thoracic extension stretch with noodle along spine x 10  Scap squeeze with ER red TB x 10  W with red TB x 10  Cervical lateral cervical flexion 3 sec x 5 R/L  Cervical rotation x 5 R/L    [facility] Adult PT Treatment:                                                DATE: 10/07/2023 Therapeutic Exercise: Shoulder flexion stretch at counter 10x10 3-way doorway pec stretch 3x30 S/L open books --> hand behind head Neuromuscular re-ed: Standing with noodle: Shoulder ER + scapula retraction Arm raises + 1#DB x10 each --> scaption, abduction, flexion Shoulder extension to neutral + RTB Staggered stance shoulder flexion scoop forward + TA activation with GTB Seated thoracic extension stretch over yoga mat roll + pillowcase to support neutral cervical alignment   [facility] Adult PT Treatment:                                                DATE: 10/02/2023 Therapeutic Exercise: Supine HS stretch with strap 2x30 (bil) Bridges + black TB for hip abd iso 2x10 Neuromuscular re-ed: Supine: Hip abd + isometric hold with Blue TB10x10 Supine cervical retraction with towel roll 10x5 Shoulder horiz abd + RTB Diagonal pull + RTB Modified dead bug + orange PB braced b/w knees and hands (feet on mat) Shoulder flexion with dowel + noodle horizontal at upper-mid back --> tactile assist for cervical  elongation Reactive  isometric row + GTB  Shoulder extension pull down + RTB Therapeutic Activity: Seated hip hinge with dowel Standing hip hinge dead lift with dowel                                                                                                      PATIENT EDUCATION:  Education details: Updated HEP  Person educated: Patient Education method: Explanation, Demonstration, Actor cues, Verbal cues, and Handouts Education comprehension: verbalized understanding, returned demonstration, verbal cues required, tactile cues required, and needs further education  HOME EXERCISE PROGRAM: Access Code: [email or web address] URL: [email or web address] Date: 10/07/2023 Prepared by: Sims Duck  Exercises - Hooklying Hamstring Stretch with Strap  - 2 x daily - 7 x weekly - 1 sets - 3 reps - 30 sec  hold - Hooklying Single Knee to Chest Stretch  - 2 x daily - 7 x weekly - 1 sets - 3-5 reps - 10-15 sec  hold - Supine Sciatic Nerve Glide  - 1 x daily - 7 x weekly - 3 sets - 10 reps - Seated Sciatic Tensioner  - 1 x daily - 7 x weekly - 3 sets - 10 reps - Hooklying Single Leg Bent Knee Fallouts with Resistance  - 1 x daily - 7 x weekly - 3 sets - 10 reps - Supine Bridge with Resistance Band  - 1 x daily - 7 x weekly - 3 sets - 10 reps - Sidelying Hip Abduction  - 1 x daily - 7 x weekly - 3 sets - 10 reps - Sidelying Over and Back  - 1 x daily - 7 x weekly - 3 sets - 10 reps - Seated Upper Trap Stretch  - 3 x daily - 7 x weekly - 1 sets - 3-5 reps - 10-30 sec hold - Seated Scapular Retraction  - 1 x daily - 7 x weekly - 3 sets - 10 reps - 5 sec hold - Supine Cervical Retraction with Towel  - 1 x daily - 7 x weekly - 3 sets - 10 reps - 5 sec hold - Standing 'L' Stretch at Counter  - 1 x daily - 7 x weekly - 3 sets - 10 reps - Doorway Pec Stretch at 60 Elevation  - 2 x daily - 7 x weekly - 1 sets - 3 reps - 30 sec hold - Doorway Pec Stretch at 90 Degrees Abduction  - 2 x daily - 7 x  weekly - 1 sets - 3 reps - 30 sec hold - Doorway Pec Stretch at 120 Degrees Abduction  - 2 x daily - 7 x weekly - 1 sets - 3 reps - 30 sec hold - Sidelying Open Book  - 1 x daily - 7 x weekly - 3 sets - 10 reps   ASSESSMENT:  CLINICAL IMPRESSION: Patient report good improvement in mobility and strength. Continued treatment with focus on strength and stabilization. Note increased cervical and lumbar AROM.  Patient will benefit from continued treatment to achieve maximum rehab potential.   RE-EVAL: Patient is a 68 yo male who presents  following ACDF 07/31/23. [name] repors that he has improved since cervical surgery. He has better cervical and shoulder ROM and is using UEs for more functional activities. Per patient report, he has no restrictions for cervical or shoulder AROM. He has lifting restriction of no more than 10#. Patient presents with poor cervical and thoracic posture and alignment; limited spinal mobility and ROM; decreased functional strength bilat UE's; decreased grip strength bilat; muscular tightness to palpation through the cervical and upper trap musculature; pain and stiffness in the cervical and lumbar spine; decreased balance. Patient will benefit from spinal stabilization and core strengthening; postural correction; U/LE strengthening; progression of functional activity level and endurance. Patient is an excellent candidate for physical therapy to address deficits identified.   EVAL: Patient is a 68 y.o. male who was seen today for physical therapy evaluation and treatment for low back pain with diagnosis of lumbar spondylosis. He had a ACDF for cervical cord compression with decreased strength due to compression 07/31/23 and remains on restrictions including gentle cervical ROM and no lifting. 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 He has been treated with chiropractic care and ESI injections. He presents with poor posture and alignment;  limited trunk and LE mobility and ROM; decreased strength core and LE's; peripheral neuropathy bilat LE's; poor balance and history of frequent falls. Patient has intermittent LE pain in either LE. He reports that he has a sedentary lifestyle and fatigues quickly with physical activities. He will benefit from PT to address areas of deficits identified.   OBJECTIVE IMPAIRMENTS: Abnormal gait, decreased activity tolerance, decreased balance, decreased mobility, decreased ROM, decreased strength, impaired flexibility, improper body mechanics, postural dysfunction, and pain.   ACTIVITY LIMITATIONS: carrying, lifting, bending, sitting, standing, squatting, sleeping, and stairs  PARTICIPATION LIMITATIONS: meal prep, cleaning, laundry, shopping, community activity, and yard work  PERSONAL FACTORS: Age, Behavior pattern, Fitness, Past/current experiences, Time since onset of injury/illness/exacerbation, and comorbidities: AODM; peripheral neuropathy, sedentary lifestyle, weakness, decreased endurance are also affecting patient's functional outcome.   REHAB POTENTIAL: Good  CLINICAL DECISION MAKING: Evolving/moderate complexity  EVALUATION COMPLEXITY: Moderate   GOALS: Goals reviewed with patient? Yes  SHORT TERM GOALS: Target date: 10/23/2023  Independent in initial HEP  Baseline: Goal status: INITIAL  2.  Increase activity tolerance with patient tolerating 20 min of exercise without fatigue reported  Baseline:  Goal status: INITIAL  3.  Patient reports walking for grocery shopping without difficulty  Baseline:  Goal status: INITIAL   LONG TERM GOALS: Target date: 11/20/2023  Increase U/LE strength to 4+/5 to 5/5  Baseline:  Goal status: INITIAL  2.  Improve functional strength with patient demonstrating decrease in timed sit to stand by 3-5 sec  Baseline:  Goal status: INITIAL  3.  Improve activity tolerance with patient to tolerate 30-45 minutes of therapeutic activity and  exercise with only 1-2 periods of 2-3 min rest to recovery  Baseline:  Goal status: INITIAL  4.  Independent in HEP including aquatic therapy as indicated  Baseline:  Goal status: INITIAL  5.  Improve Modified Owestry score by 10-15 points  Baseline: 30/50; 60%  Goal status: INITIAL   6. Increase cervical AROM in extension, lateral flexion, rotation by 5-7 degrees to improve functional activity - checking traffic when driving    Baseline:    Goal status; INITIAL    7. Decrease pain in the cervical and lumbar spine by 50-70% allowing patient to increase functional  activity level    Baseline:    Goal status: INITIAL    8. Improve Single leg stance time to =/> 10 sec without UE support to improve balance and stability in standing   Baseline:   Goal status: INITIAL   9. Improve Quick DASH score by 10 points demonstrating improved UE function  Baseline:  31.8/100; 31.8% Goal status: INITIAL  PLAN:  PT FREQUENCY: 2x/week  PT DURATION: 8 weeks  PLANNED INTERVENTIONS: 97164- PT Re-evaluation, 97110-Therapeutic exercises, 97530- Therapeutic activity, 97112- Neuromuscular re-education, 97535- Self Care, 16109- Manual therapy, [phone]- Gait training, [phone]- Aquatic Therapy, Patient/Family education, Balance training, Stair training, Taping, Dry Needling, and Joint mobilization.  PLAN FOR NEXT SESSION:  Review and progress exercises; continue spine care education; manual work and modalities as indicated Improve thoracic extension; progress balance activities    Twisha Vanpelt Hadley Leu, PT 10/09/2023, 2:54 PM

## 2023-10-14 ENCOUNTER — Ambulatory Visit

## 2023-10-14 DIAGNOSIS — M5441 Lumbago with sciatica, right side: Secondary | ICD-10-CM | POA: Diagnosis not present

## 2023-10-14 DIAGNOSIS — R2681 Unsteadiness on feet: Secondary | ICD-10-CM

## 2023-10-14 DIAGNOSIS — M47816 Spondylosis without myelopathy or radiculopathy, lumbar region: Secondary | ICD-10-CM

## 2023-10-14 DIAGNOSIS — R29898 Other symptoms and signs involving the musculoskeletal system: Secondary | ICD-10-CM

## 2023-10-14 DIAGNOSIS — M5442 Lumbago with sciatica, left side: Secondary | ICD-10-CM | POA: Diagnosis not present

## 2023-10-14 DIAGNOSIS — G8929 Other chronic pain: Secondary | ICD-10-CM | POA: Diagnosis not present

## 2023-10-14 DIAGNOSIS — M539 Dorsopathy, unspecified: Secondary | ICD-10-CM

## 2023-10-14 DIAGNOSIS — M6281 Muscle weakness (generalized): Secondary | ICD-10-CM

## 2023-10-14 NOTE — Therapy (Addendum)
 OUTPATIENT PHYSICAL THERAPY THORACOLUMBAR TREATMENT PHYSICAL THERAPY DISCHARGE SUMMARY  Visits from Start of Care: 9  Current functional level related to goals / functional outcomes: See progress note for discharge status    Remaining deficits: Unknown    Education / Equipment: HEP    Patient agrees to discharge. Patient goals were partially met. Patient is being discharged due to not returning since the last visit.  Celyn P. Ina PT, MPH 12/19/23 9:14 AM     Patient Name: Mackie Holness MRN: [id number] DOB:06/17/1955, 68 y.o., male Today's Date: 10/14/2023  END OF SESSION:  PT End of Session - 10/14/23 1404     Visit Number 9    Number of Visits 16    Date for PT Re-Evaluation 11/20/23    Authorization Type [employer] medicare $10 copay    Progress Note Due on Visit 10    PT Start Time 1405    PT Stop Time 1447    PT Time Calculation (min) 42 min    Activity Tolerance Patient tolerated treatment well    Behavior During Therapy WFL for tasks assessed/performed         Past Medical History:  Diagnosis Date   Allergy 1980   Anxiety 1982   Arthritis 1995   Asthma    COPD (chronic obstructive pulmonary disease) (HCC) 2024   Depression 1982   Diabetes mellitus without complication (HCC)    Diabetic neuropathy (HCC)    feet   GERD (gastroesophageal reflux disease) 1992   History of hiatal hernia    Hypertension    Neuromuscular disorder (HCC)    Neuropathy bilateral arms from cervical spine   Pneumonia    Ulcer 1988   Past Surgical History:  Procedure Laterality Date   ANKLE SURGERY Left    As a child. Almost sliced in half   ANTERIOR CERVICAL DECOMP/DISCECTOMY FUSION N/A 07/31/2023   Procedure: ANTERIOR CERVICAL DECOMPRESSION/DISCECTOMY FUSION 1 LEVEL;  Surgeon: Beuford Anes, MD;  Location: [facility] OR;  Service: Orthopedics;  Laterality: N/A;  ANTERIOR CERVICAL DECOMPRESSION FUSION CERVICAL 3- CERVICAL 4 WITH INSTRUMENTATION AND ALLOGRAFT   APPENDECTOMY  01/2017    CHOLECYSTECTOMY  1995   COLON RESECTION N/A 04/2003   COLONOSCOPY  07/2002   12/2009; 12/2014   GANGLION CYST EXCISION Left 01/2002   left elbow   INGUINAL HERNIA REPAIR Right 2024   KNEE ARTHROSCOPY WITH MEDIAL MENISECTOMY Left 07/2017   NECK SURGERY  01/2014   NEVUS EXCISION     Back   ROTATOR CUFF REPAIR Right 12/2001   SPINE SURGERY  2018   Cervical Neck Fusion   TOOTH EXTRACTION     15 teeth removed due to infection   VASECTOMY  1996   Patient Active Problem List   Diagnosis Date Noted   HTN, goal below 130/80 09/06/2023   Type 2 diabetes mellitus with diabetic neuropathy, without long-term current use of insulin  (HCC) 09/06/2023   Panic attack 05/28/2023   COPD exacerbation (HCC) 05/13/2023   Acute cough 05/13/2023   Primary osteoarthritis of both hands 05/02/2023   Primary osteoarthritis of both knees 05/02/2023   Polyarthralgia 04/03/2023   Cervical fusion syndrome 01/14/2023   Peripheral polyneuropathy 01/14/2023   Weak urinary stream 01/14/2023   Chronic obstructive pulmonary disease (HCC) 01/02/2023   Type 2 diabetes mellitus without complication, without long-term current use of insulin  (HCC) 12/05/2022   Lumbar spondylosis 12/05/2022   History of CAD (coronary artery disease) 12/05/2022   Mixed hyperlipidemia 12/05/2022   GAD (generalized anxiety disorder)  12/05/2022   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 episode (HCC) 12/05/2022   Gastroesophageal reflux disease without esophagitis 12/05/2022   Wheezing 12/05/2022   Bilateral inguinal hernia without obstruction or gangrene 05/15/2022   Anxiety 04/25/2022   S/P left knee arthroscopy 07/26/2017   Acute medial meniscus tear of left knee 07/03/2017   Perforated appendicitis 01/29/2017   Anterior basement membrane dystrophy 02/27/2016   Hematuria, microscopic 09/16/2012    PCP: Dr Bernice GORMAN [place],  REFERRING PROVIDER: Dr Oneil Priestly   Dr Debby Petties  LBP  REFERRING DIAG: Neck pain with history of spinal surgery;  Lumbar spondylosis  Rationale for Evaluation and Treatment: Rehabilitation  THERAPY DIAG:  Other symptoms and signs involving the musculoskeletal system  Muscle weakness (generalized)  Cervical dysfunction  Unsteadiness  Chronic bilateral low back pain with sciatica, sciatica laterality unspecified  Lumbar spondylosis  ONSET DATE: 08/22/23 flare up of long standing back pain  SUBJECTIVE:                                                                                                                                                                                           SUBJECTIVE STATEMENT: Patient reports he has not had any sciatic flare ups for a while; states he noticed his Lt shoulder sits forward compared to Rt.  RE-EVAL; 09/25/23; patient reports that he had as second cervical disc fusion 07/29/23 to remove old hardware and fuse adjacent levels. First cervical fusion was C4/5 in February, 2018. Ubaldo thinks he is now fused C3 - C5. Surgery was done on an outpatient basis. He has had some post op soreness and pain. He has arthritis in both arms and hands. He trimmed some bushes yesterday and had stiffness and tightness in R > L hand.    EVAL: Patient reports that he has has sciatica for 35 years. He had a flare up of R LB and R LE about a week ago. Symptoms have resolved today. He has some good days and bad days. He does not know what causes the difference in how he feels. He has had epidural steroid shots for years. He has pain in both sides at different times. He has sensation of weakness and stiffness. Has been sedentary for the past few months because of neck pain.   PERTINENT HISTORY:  ACDF 07/31/23; first cervical fusion 2018; arthritis; AODM; HTN; anxiety; has had 14 surgeries - neck, shoulder, knee, ankle, elbow surgeries, appendectomy, gallbladder surgery, double hernia surgeries    PAIN:  Are you having pain?  Yes: NPRS scale: 0/10 in neck  Pain location: cervical/thoracic spine to bilat upper traps  Pain description: soreness   Aggravating factors: use of UE's   Relieving factors: heating pad; ice; OTC meds; muscle relaxer    Are you having pain? Yes: NPRS scale: 0/10   Pain location: R LB Pain description: soreness; - at times posterior hip, thigh, calf to ankle  Aggravating factors: overuse   Relieving factors: heating pad; OTC meds; muscle relaxer   PRECAUTIONS: Other: ACDF 07/29/23 - limited to gentle cervical movement; no lifting > 10 pounds    WEIGHT BEARING RESTRICTIONS: No  FALLS:  Has patient fallen in last 6 months? Yes. 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  DIAGNOSTIC FINDINGS:  MRi lumbar spine 03/11/23; 1. Progressive multilevel lumbar spondylosis as described above. 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 2. Worsened now moderate bilateral lateral recess stenosis and mild-to-moderate spinal canal stenosis at L4-L5.  PATIENT SURVEYS:  Modified Oswestry 30/50; 60% limitation  09/25/23: Quick DASH 31.8/100; 31.8%    COGNITION: Overall cognitive status: Within functional limits for tasks assessed     SENSATION: Peripherial neuropathy bilat LE's x 10 yrs   MUSCLE LENGTH: Hamstrings: Right 55 deg; Left 65 deg   POSTURE: rounded shoulders, forward head, decreased lumbar lordosis, increased thoracic kyphosis, weight shift left, and L LE shorter that R in standing   PALPATION: Tightness bilat hip flexors - hx of bilat hernia repair and bowel resection  CERVICAL ROM: stiffness with all cervical movements   Active ROM A/PROM (deg) eval AROM Deg 10/09/23  Flexion 46 51  Extension 13 25  Right lateral flexion 24 24  Left lateral flexion 14 15  Right rotation 39 39  Left rotation 23 27   (Blank rows = not tested)   UE ROM:  Shoulder  Flexion R - 140; L 140  Extension R - 74; L 60  Abduction R - 133; L 133  IR R - T11/12; T12/L1             ER R - 75; L 70  UE STRENGTH:  Bilat UE strength - grossly WFL not assessed resistively due to post op restrictions  Grip strength R 67#; L 65#   LUMBAR ROM:   AROM eval 10/09/23  Flexion 60% pull B back and thighs 75% pull in hamstrings   Extension 30%  40%  Right lateral flexion 70%  85%  Left lateral flexion 55% tight R LB 60% tight   Right rotation 50% 60%  Left rotation 50%  60%   (Blank rows = not tested)  LOWER EXTREMITY ROM:  end range tightness R > L hips - assessed in supine due to ACDF   Active  Right eval Left  eval  Hip flexion    Hip extension    Hip abduction    Hip adduction    Hip internal rotation    Hip external rotation    Knee flexion    Knee extension    Ankle dorsiflexion    Ankle plantarflexion    Ankle inversion    Ankle eversion     (Blank rows = not tested)  LOWER EXTREMITY MMT:  assessed in supine due to ACDF   MMT Right eval Left eval  Hip flexion 4- 4  Hip extension 4- 4-  Hip abduction 4- 4-  Hip adduction    Hip internal rotation    Hip external rotation    Knee flexion    Knee extension     Ankle dorsiflexion    Ankle plantarflexion    Ankle inversion    Ankle eversion     (Blank rows = not tested)  LUMBAR SPECIAL TESTS:  Straight leg raise test: Negative and Slump test: Negative  FUNCTIONAL TESTS:  5 times sit to stand: 20.5 sec  SLS - unable to balance on either leg   09/25/23: 5 times sit to stand: 18.49 sec     SLS R 5 sec; L 2 sec  GAIT: Distance walked: 40 feet Assistive device utilized: None Level of assistance: Complete Independence Comments: fwd flexed trunk; LE's in ER   [facility] Adult PT Treatment:                                                DATE: 10/14/2023 Therapeutic Exercise: Shoulder shrugs & bkwd circles x10 each Doorway pec stretch 3x30 Cervical rotation stretch with pillowcase & gentle overpressure Neuromuscular re-ed: Scap push ups at wall Scapula clock + YTB Reactive isometrics shoulder ER step out/in + GTB Rows + blue TB Shoulder extension to neutral + RTB Supine + large towel along spine Shoulder horizontal abduction + RTB Upward diagonal pulls + RTB  Cervical retraction with towel roll behind head     [facility] Adult PT Treatment:                                                DATE: 10/09/2023 Therapeutic Exercise: Shoulder flexion stretch at counter 10x10 3-way doorway pec stretch 3x30 S/L open books --> hand behind head x 10 R/L  Neuromuscular re-ed: Standing with noodle: Shoulder ER + scapula retraction Arm raises + 1#DB x10 each --> scaption, abduction, flexion Supine cervical retraction with towel roll 10x5 Supine shoulder horiz abd + RTB x 10  Supine sash + RTB R/L x 10  Shoulder flexion + RTB x 10  Seated  Thoracic extension stretch with noodle along spine x 10  Scap squeeze with ER red TB x 10  W with red TB x 10  Cervical lateral cervical flexion 3 sec x 5 R/L  Cervical rotation x 5 R/L  PATIENT EDUCATION:   Education details: Updated HEP  Person educated: Patient Education method: Explanation, Demonstration, Tactile cues, Verbal cues, and Handouts Education comprehension: verbalized understanding, returned demonstration, verbal cues required, tactile cues required, and needs further education  HOME EXERCISE PROGRAM: Access Code: [email or web address] URL: [email or web address] Date: 10/07/2023 Prepared by: Lamarr Price  Exercises - Hooklying Hamstring Stretch with Strap  - 2 x daily - 7 x weekly - 1 sets - 3 reps - 30 sec  hold - Hooklying Single Knee to Chest Stretch  - 2 x daily - 7 x weekly - 1 sets - 3-5 reps - 10-15 sec  hold - Supine Sciatic Nerve Glide  - 1 x daily - 7 x weekly - 3 sets - 10 reps - Seated Sciatic Tensioner  - 1 x daily - 7 x weekly - 3 sets - 10 reps - Hooklying Single Leg Bent Knee Fallouts with Resistance  - 1 x daily - 7 x weekly - 3 sets - 10 reps - Supine Bridge with Resistance Band  - 1 x daily - 7 x weekly - 3 sets - 10 reps - Sidelying Hip Abduction  - 1 x daily - 7 x weekly - 3 sets - 10 reps - Sidelying Over and Back  - 1 x daily - 7 x weekly - 3 sets - 10 reps - Seated Upper Trap Stretch  - 3 x daily - 7 x weekly - 1 sets - 3-5 reps - 10-30 sec hold - Seated Scapular Retraction  - 1 x daily - 7 x weekly - 3 sets - 10 reps - 5 sec hold - Supine Cervical Retraction with Towel  - 1 x daily - 7 x weekly - 3 sets - 10 reps - 5 sec hold - Standing 'L' Stretch at Counter  - 1 x daily - 7 x weekly - 3 sets - 10 reps - Doorway Pec Stretch at 60 Elevation  - 2 x daily - 7 x weekly - 1 sets - 3 reps - 30 sec hold - Doorway Pec Stretch at 90 Degrees Abduction  - 2 x daily - 7 x weekly - 1 sets - 3 reps - 30 sec hold - Doorway Pec Stretch at 120 Degrees Abduction  - 2 x daily - 7 x weekly - 1 sets - 3 reps - 30 sec hold - Sidelying Open Book  - 1 x daily - 7 x weekly - 3 sets - 10 reps   ASSESSMENT:  CLINICAL IMPRESSION: Session focused on postural and cervical  exercises due to patient reporting LE/low back feeling better with no pain. Patient continues to demonstrate frequent upper trap compensation and forward rounded shoulders with standing postural exercises; tactile cues improve posture however patient had difficulty maintaining proper body mechanics.   RE-EVAL: Patient is a 68 yo male who presents following ACDF 07/31/23. Ubaldo repors that he has improved since cervical surgery. He has better cervical and shoulder ROM and is using UEs for more functional activities. Per patient report, he has no restrictions for cervical or shoulder AROM. He has lifting restriction of no more than 10#. Patient presents with poor cervical and thoracic posture and alignment; limited spinal mobility and ROM; decreased functional strength bilat UE's; decreased grip strength bilat; muscular tightness to palpation through the cervical and upper trap musculature; pain and stiffness in the cervical and lumbar spine; decreased balance. Patient will benefit from spinal stabilization and core strengthening; postural correction; U/LE strengthening; progression of functional activity level and  endurance. Patient is an excellent candidate for physical therapy to address deficits identified.   EVAL: Patient is a 68 y.o. male who was seen today for physical therapy evaluation and treatment for low back pain with diagnosis of lumbar spondylosis. He had a ACDF for cervical cord compression with decreased strength due to compression 07/31/23 and remains on restrictions including gentle cervical ROM and no lifting. 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 He has been treated with chiropractic care and ESI injections. He presents with poor posture and alignment; limited trunk and LE mobility and ROM; decreased strength core and LE's; peripheral neuropathy bilat LE's; poor balance and history of frequent falls. Patient has intermittent LE pain in either LE.  He reports that he has a sedentary lifestyle and fatigues quickly with physical activities. He will benefit from PT to address areas of deficits identified.   OBJECTIVE IMPAIRMENTS: Abnormal gait, decreased activity tolerance, decreased balance, decreased mobility, decreased ROM, decreased strength, impaired flexibility, improper body mechanics, postural dysfunction, and pain.   ACTIVITY LIMITATIONS: carrying, lifting, bending, sitting, standing, squatting, sleeping, and stairs  PARTICIPATION LIMITATIONS: meal prep, cleaning, laundry, shopping, community activity, and yard work  PERSONAL FACTORS: Age, Behavior pattern, Fitness, Past/current experiences, Time since onset of injury/illness/exacerbation, and comorbidities: AODM; peripheral neuropathy, sedentary lifestyle, weakness, decreased endurance are also affecting patient's functional outcome.   REHAB POTENTIAL: Good  CLINICAL DECISION MAKING: Evolving/moderate complexity  EVALUATION COMPLEXITY: Moderate   GOALS: Goals reviewed with patient? Yes  SHORT TERM GOALS: Target date: 10/23/2023  Independent in initial HEP  Baseline: Goal status: INITIAL  2.  Increase activity tolerance with patient tolerating 20 min of exercise without fatigue reported  Baseline:  Goal status: INITIAL  3.  Patient reports walking for grocery shopping without difficulty  Baseline:  Goal status: INITIAL   LONG TERM GOALS: Target date: 11/20/2023  Increase U/LE strength to 4+/5 to 5/5  Baseline:  Goal status: INITIAL  2.  Improve functional strength with patient demonstrating decrease in timed sit to stand by 3-5 sec  Baseline:  Goal status: INITIAL  3.  Improve activity tolerance with patient to tolerate 30-45 minutes of therapeutic activity and exercise with only 1-2 periods of 2-3 min rest to recovery  Baseline:  Goal status: INITIAL  4.  Independent in HEP including aquatic therapy as indicated  Baseline:  Goal status: INITIAL  5.   Improve Modified Owestry score by 10-15 points  Baseline: 30/50; 60%  Goal status: INITIAL   6. Increase cervical AROM in extension, lateral flexion, rotation by 5-7 degrees to improve functional activity - checking traffic when driving    Baseline:    Goal status; INITIAL    7. Decrease pain in the cervical and lumbar spine by 50-70% allowing patient to increase functional activity level    Baseline:    Goal status: INITIAL    8. Improve Single leg stance time to =/> 10 sec without UE support to improve balance and stability in standing   Baseline:   Goal status: INITIAL   9. Improve Quick DASH score by 10 points demonstrating improved UE function  Baseline:  31.8/100; 31.8% Goal status: INITIAL  PLAN:  PT FREQUENCY: 2x/week  PT DURATION: 8 weeks  PLANNED INTERVENTIONS: 97164- PT Re-evaluation, 97110-Therapeutic exercises, 97530- Therapeutic activity, 97112- Neuromuscular re-education, 97535- Self Care, [id number]- Manual therapy, [phone]- Gait training, [phone]- Aquatic Therapy, Patient/Family education, Balance training, Stair training, Taping, Dry Needling, and Joint  mobilization.  PLAN FOR NEXT SESSION:  Focus on shoulder, cervical, postural strengthening; thoracic mobility/extension   Lamarr GORMAN Price, PTA 10/14/2023, 2:48 PM

## 2023-10-16 ENCOUNTER — Ambulatory Visit

## 2023-10-21 ENCOUNTER — Other Ambulatory Visit: Payer: Self-pay | Admitting: Sports Medicine

## 2023-10-21 DIAGNOSIS — M47816 Spondylosis without myelopathy or radiculopathy, lumbar region: Secondary | ICD-10-CM

## 2023-10-28 DIAGNOSIS — M5412 Radiculopathy, cervical region: Secondary | ICD-10-CM | POA: Diagnosis not present

## 2023-11-04 DIAGNOSIS — M47812 Spondylosis without myelopathy or radiculopathy, cervical region: Secondary | ICD-10-CM | POA: Diagnosis not present

## 2023-11-15 ENCOUNTER — Other Ambulatory Visit: Payer: Self-pay | Admitting: Urgent Care

## 2023-11-15 MED ORDER — ALBUTEROL SULFATE HFA 108 (90 BASE) MCG/ACT IN AERS: 1.0000 | INHALATION_SPRAY | Freq: Four times a day (QID) | RESPIRATORY_TRACT | 2 refills | Status: AC | PRN

## 2023-11-15 NOTE — Progress Notes (Signed)
 Rx request for inhaler received. Called in

## 2023-11-26 DIAGNOSIS — M47812 Spondylosis without myelopathy or radiculopathy, cervical region: Secondary | ICD-10-CM | POA: Diagnosis not present

## 2023-12-13 ENCOUNTER — Encounter: Payer: Self-pay | Admitting: Physician Assistant

## 2023-12-13 ENCOUNTER — Ambulatory Visit: Admitting: Physician Assistant

## 2023-12-13 VITALS — BP 147/84 | HR 74 | Ht 71.0 in | Wt 185.0 lb

## 2023-12-13 DIAGNOSIS — F3342 Major depressive disorder, recurrent, in full remission: Secondary | ICD-10-CM | POA: Diagnosis not present

## 2023-12-13 DIAGNOSIS — K4091 Unilateral inguinal hernia, without obstruction or gangrene, recurrent: Secondary | ICD-10-CM

## 2023-12-13 DIAGNOSIS — J449 Chronic obstructive pulmonary disease, unspecified: Secondary | ICD-10-CM

## 2023-12-13 DIAGNOSIS — E559 Vitamin D deficiency, unspecified: Secondary | ICD-10-CM | POA: Diagnosis not present

## 2023-12-13 DIAGNOSIS — E1140 Type 2 diabetes mellitus with diabetic neuropathy, unspecified: Secondary | ICD-10-CM

## 2023-12-13 DIAGNOSIS — G629 Polyneuropathy, unspecified: Secondary | ICD-10-CM | POA: Diagnosis not present

## 2023-12-13 DIAGNOSIS — Z0000 Encounter for general adult medical examination without abnormal findings: Secondary | ICD-10-CM

## 2023-12-13 DIAGNOSIS — E782 Mixed hyperlipidemia: Secondary | ICD-10-CM

## 2023-12-13 DIAGNOSIS — F411 Generalized anxiety disorder: Secondary | ICD-10-CM

## 2023-12-13 DIAGNOSIS — Z8719 Personal history of other diseases of the digestive system: Secondary | ICD-10-CM

## 2023-12-13 DIAGNOSIS — I10 Essential (primary) hypertension: Secondary | ICD-10-CM

## 2023-12-13 DIAGNOSIS — E119 Type 2 diabetes mellitus without complications: Secondary | ICD-10-CM

## 2023-12-13 DIAGNOSIS — Z125 Encounter for screening for malignant neoplasm of prostate: Secondary | ICD-10-CM | POA: Diagnosis not present

## 2023-12-13 DIAGNOSIS — Z87891 Personal history of nicotine dependence: Secondary | ICD-10-CM

## 2023-12-13 DIAGNOSIS — K219 Gastro-esophageal reflux disease without esophagitis: Secondary | ICD-10-CM

## 2023-12-13 DIAGNOSIS — Z7984 Long term (current) use of oral hypoglycemic drugs: Secondary | ICD-10-CM

## 2023-12-13 LAB — POCT GLYCOSYLATED HEMOGLOBIN (HGB A1C): Hemoglobin A1C: 5.8 % — AB (ref 4.0–5.6)

## 2023-12-13 MED ORDER — METFORMIN HCL 500 MG PO TABS: 500.0000 mg | ORAL_TABLET | Freq: Every day | ORAL | 3 refills | Status: AC

## 2023-12-13 MED ORDER — PREGABALIN 150 MG PO CAPS: 150.0000 mg | ORAL_CAPSULE | Freq: Two times a day (BID) | ORAL | 0 refills | Status: DC

## 2023-12-13 MED ORDER — NYSTATIN 100000 UNIT/ML MT SUSP: 500000.0000 [IU] | Freq: Four times a day (QID) | OROMUCOSAL | 0 refills | Status: AC

## 2023-12-13 NOTE — Progress Notes (Signed)
 Complete physical exam  Patient: Marcus Brock   DOB: December 06, 1955   68 y.o. Male  MRN: [id number]  Subjective:    Chief Complaint  Patient presents with   Annual Exam    Marcus Brock is a 68 y.o. male who presents today for a complete physical exam. He reports consuming a general diet. The patient does not participate in regular exercise at present. He generally feels well. He reports sleeping well. He does have additional problems to discuss today.   He requests medication for thrush from his inhalers.   He would like to try lyrica  instead of gabapentin . Gabapentin  seemed to cause memory issues.   He is concerned about recurrence of hernia on the left side. Hx of hernia last February like was repaired. He feels a lot of pressure in left inguinal area.    Most recent fall risk assessment:    12/13/2023   11:35 AM  Fall Risk   Falls in the past year? 1  Number falls in past yr: 1  Injury with Fall? 1     Most recent depression screenings:    12/13/2023   11:35 AM 02/26/2023    2:26 PM  PHQ 2/9 Scores  PHQ - 2 Score 4 2  PHQ- 9 Score 7 7    Vision:Within last year and Dental: No current dental problems and Receives regular dental care  Patient Active Problem List   Diagnosis Date Noted   History of left inguinal hernia repair 12/24/2023   Recurrent left inguinal hernia 12/24/2023   Vitamin D  deficiency 12/24/2023   HTN, goal below 130/80 09/06/2023   Type 2 diabetes mellitus with diabetic neuropathy, without long-term current use of insulin  (HCC) 09/06/2023   Panic attack 05/28/2023   COPD exacerbation (HCC) 05/13/2023   Acute cough 05/13/2023   Primary osteoarthritis of both hands 05/02/2023   Primary osteoarthritis of both knees 05/02/2023   Polyarthralgia 04/03/2023   Cervical fusion syndrome 01/14/2023   Peripheral polyneuropathy 01/14/2023   Weak urinary stream 01/14/2023   Chronic obstructive pulmonary disease (HCC) 01/02/2023   Type 2 diabetes  mellitus without complication, without long-term current use of insulin  (HCC) 12/05/2022   Lumbar spondylosis 12/05/2022   History of CAD (coronary artery disease) 12/05/2022   Mixed hyperlipidemia 12/05/2022   GAD (generalized anxiety disorder) 12/05/2022   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 episode (HCC) 12/05/2022   Gastroesophageal reflux disease without esophagitis 12/05/2022   Wheezing 12/05/2022   Bilateral inguinal hernia without obstruction or gangrene 05/15/2022   Anxiety 04/25/2022   S/P left knee arthroscopy 07/26/2017   Acute medial meniscus tear of left knee 07/03/2017   Perforated appendicitis 01/29/2017   Anterior basement membrane dystrophy 02/27/2016   Hematuria, microscopic 09/16/2012   Past Medical History:  Diagnosis Date   Allergy 1980   Anxiety 1982   Arthritis 1995   Asthma    COPD (chronic obstructive pulmonary disease) (HCC) 2024   Depression 1982   Diabetes mellitus without complication (HCC)    Diabetic neuropathy (HCC)    feet   GERD (gastroesophageal reflux disease) 1992   History of hiatal hernia    Hypertension    Neuromuscular disorder (HCC)    Neuropathy bilateral arms from cervical spine   Pneumonia    Ulcer 1988   Past Surgical History:  Procedure Laterality Date   ANKLE SURGERY Left    As a child. Almost sliced in half   ANTERIOR CERVICAL DECOMP/DISCECTOMY FUSION N/A 07/31/2023  Procedure: ANTERIOR CERVICAL DECOMPRESSION/DISCECTOMY FUSION 1 LEVEL;  Surgeon: Beuford Anes, MD;  Location: [facility] OR;  Service: Orthopedics;  Laterality: N/A;  ANTERIOR CERVICAL DECOMPRESSION FUSION CERVICAL 3- CERVICAL 4 WITH INSTRUMENTATION AND ALLOGRAFT   APPENDECTOMY  01/2017   CHOLECYSTECTOMY  1995   COLON RESECTION N/A 04/2003   COLONOSCOPY  07/2002   12/2009; 12/2014   GANGLION CYST EXCISION Left 01/2002   left elbow   INGUINAL HERNIA REPAIR Right 2024   KNEE ARTHROSCOPY WITH MEDIAL MENISECTOMY Left 07/2017   NECK  SURGERY  01/2014   NEVUS EXCISION     Back   ROTATOR CUFF REPAIR Right 12/2001   SPINE SURGERY  2018   Cervical Neck Fusion   TOOTH EXTRACTION     15 teeth removed due to infection   VASECTOMY  1996   Allergies  Allergen Reactions   Penicillins Other (See Comments)    Received as newborn for ear infection and mother said it was a bad reaction   Gabapentin      Memory issues.    Wasp Venom Hives      Patient Care Team: Bevin Bernice RAMAN, DO (Inactive) as PCP - General (Family Medicine)   Outpatient Medications Prior to Visit  Medication Sig   acetaminophen  (TYLENOL ) 650 MG CR tablet Take 1 tablet (650 mg total) by mouth every 8 (eight) hours as needed for pain.   albuterol  (VENTOLIN  HFA) 108 (90 Base) MCG/ACT inhaler Inhale 1-2 puffs into the lungs every 6 (six) hours as needed for wheezing or shortness of breath.   ALPRAZolam  (XANAX ) 0.5 MG tablet Take 1 tablet (0.5 mg total) by mouth 2 (two) times daily as needed for anxiety.   aspirin EC 81 MG tablet Take 81 mg by mouth daily.   atorvastatin  (LIPITOR) 40 MG tablet Take 1 tablet (40 mg total) by mouth at bedtime.   Budeson-Glycopyrrol-Formoterol (BREZTRI  AEROSPHERE) 160-9-4.8 MCG/ACT AERO Inhale 2 puffs into the lungs 2 (two) times daily.   Cholecalciferol (VITAMIN D3) 50 MCG (2000 UT) capsule Take 2,000 Units by mouth daily.   DULoxetine  (CYMBALTA ) 60 MG capsule Take 1 capsule (60 mg total) by mouth 2 (two) times daily.   EPINEPHrine  0.3 mg/0.3 mL IJ SOAJ injection Inject 0.3 mg into the muscle as needed for anaphylaxis.   famotidine (PEPCID) 20 MG tablet Take 20 mg by mouth daily as needed for heartburn or indigestion.   ipratropium-albuterol  (DUONEB) 0.5-2.5 (3) MG/3ML SOLN Take 3 mLs by nebulization every 6 (six) hours as needed.   losartan  (COZAAR ) 25 MG tablet TAKE 1 TABLET (25 MG TOTAL) BY MOUTH DAILY.   meloxicam  (MOBIC ) 15 MG tablet One tab PO every 24 hours with a meal for 2 weeks, then once every 24 hours prn pain.    Multiple Vitamins-Minerals (MULTIVITAMIN GUMMIES MENS PO) Take 2 each by mouth daily.   tamsulosin  (FLOMAX ) 0.4 MG CAPS capsule Take 1 capsule (0.4 mg total) by mouth daily.   tiZANidine  (ZANAFLEX ) 4 MG tablet Take 1 tablet (4 mg total) by mouth every 6 (six) hours as needed for muscle spasms.   [DISCONTINUED] gabapentin  (NEURONTIN ) 600 MG tablet TALE 2 TABLETS BY MOUTH 3 TIMES A DAY   [DISCONTINUED] metFORMIN  (GLUCOPHAGE ) 500 MG tablet Take 1 tablet (500 mg total) by mouth daily with breakfast.   No facility-administered medications prior to visit.    ROS See HPI.      Objective:     BP (!) 147/84   Pulse 74   Ht 5' 11 (1.803 m)  Wt 185 lb (83.9 kg)   SpO2 99%   BMI 25.80 kg/m  BP Readings from Last 3 Encounters:  12/13/23 (!) 147/84  09/04/23 (!) 143/86  07/31/23 (!) 142/87   Wt Readings from Last 3 Encounters:  12/13/23 185 lb (83.9 kg)  09/04/23 189 lb (85.7 kg)  07/31/23 193 lb (87.5 kg)    .SABRA Lab Results  Component Value Date   HGBA1C 5.8 (A) 12/13/2023     Physical Exam Constitutional:      Appearance: Normal appearance.  HENT:     Head: Normocephalic.     Right Ear: Tympanic membrane, ear canal and external ear normal. There is no impacted cerumen.     Left Ear: Tympanic membrane, ear canal and external ear normal. There is no impacted cerumen.     Nose: Congestion present.     Mouth/Throat:     Mouth: Mucous membranes are moist.     Comments: Erythematous tongue with white appearing substance back of tongue.  Eyes:     Conjunctiva/sclera: Conjunctivae normal.  Cardiovascular:     Rate and Rhythm: Normal rate and regular rhythm.     Pulses: Normal pulses.     Heart sounds: Normal heart sounds.  Pulmonary:     Effort: Pulmonary effort is normal.     Breath sounds: Normal breath sounds.  Abdominal:     General: Bowel sounds are normal. There is no distension.     Palpations: Abdomen is soft. There is no mass.     Tenderness: There is no right CVA  tenderness, left CVA tenderness, guarding or rebound.     Hernia: A hernia is present.     Comments: Appears to have left inguinal bulge non tender.   Musculoskeletal:        General: Normal range of motion.  Neurological:     General: No focal deficit present.     Mental Status: He is alert and oriented to person, place, and time.  Psychiatric:        Mood and Affect: Mood normal.         Assessment & Plan:    Routine Health Maintenance and Physical Exam  Immunization History  Administered Date(s) Administered   Fluad Quad(high Dose 65+) 03/02/2022   Fluad Trivalent(High Dose 65+) 01/02/2023   Influenza, Quadrivalent, Recombinant, Inj, Pf 04/02/2018   MMR 11/11/1998   Moderna Sars-Covid-2 Vaccination 11/27/2019, 12/25/2019   PNEUMOCOCCAL CONJUGATE-20 11/29/2021   Pneumococcal Polysaccharide-23 06/25/2014   Respiratory Syncytial Virus Vaccine,Recomb Aduvanted(Arexvy) 03/02/2022   Tdap 06/25/2014, 11/10/2021   Zoster Recombinant(Shingrix) 11/21/2016, 06/02/2017, 06/10/2017    Health Maintenance  Topic Date Due   OPHTHALMOLOGY EXAM  Never done   COVID-19 Vaccine (3 - Moderna risk series) 12/29/2023 (Originally 01/22/2020)   Lung Cancer Screening  02/26/2024 (Originally 06/12/2006)   Colonoscopy  02/26/2024 (Originally 07/11/2023)   INFLUENZA VACCINE  07/21/2024 (Originally 11/22/2023)   Medicare Annual Wellness (AWV)  02/26/2024   HEMOGLOBIN A1C  06/14/2024   Diabetic kidney evaluation - Urine ACR  09/03/2024   FOOT EXAM  09/03/2024   Diabetic kidney evaluation - eGFR measurement  12/12/2024   DTaP/Tdap/Td (3 - Td or Tdap) 11/11/2031   Pneumococcal Vaccine: 50+ Years  Completed   Hepatitis C Screening  Completed   Zoster Vaccines- Shingrix  Completed   HPV VACCINES  Aged Out   Meningococcal B Vaccine  Aged Out    Discussed health benefits of physical activity, and encouraged him to engage in regular exercise appropriate for  his age and condition.  SABRA.Start a regular  exercise program and make sure you are eating a healthy diet Try to eat 4 servings of dairy a day or take a calcium  supplement (500mg  twice a day). No falls Fasting labs ordered today Colonoscopy, pt agrees to schedule Eye exam, pt agrees to schedule Lung cancer screening will order Pt declined covid and flu vaccine  Nystatin  mouth wash for thrush Zyrtec for allergies Mucinex as needed Stop gabapentin  and trial of lyrica  Continue metformin -A1C to goal   Return in about 3 months (around 03/14/2024) for DM follow up.     Myrian Botello, PA-C

## 2023-12-13 NOTE — Patient Instructions (Signed)
 Will order CT of abdomen Nystatin  for thrush Start zyrtec daily at bedtime for allergies Consider mucinex as needed Stop gabapentin  and start lyrica  150mg  twice a day Continue metformin   Health Maintenance After Age 67 After age 69, you are at a higher risk for certain long-term diseases and infections as well as injuries from falls. Falls are a major cause of broken bones and head injuries in people who are older than age 53. Getting regular preventive care can help to keep you healthy and well. Preventive care includes getting regular testing and making lifestyle changes as recommended by your health care provider. Talk with your health care provider about: Which screenings and tests you should have. A screening is a test that checks for a disease when you have no symptoms. A diet and exercise plan that is right for you. What should I know about screenings and tests to prevent falls? Screening and testing are the best ways to find a health problem early. Early diagnosis and treatment give you the best chance of managing medical conditions that are common after age 61. Certain conditions and lifestyle choices may make you more likely to have a fall. Your health care provider may recommend: Regular vision checks. Poor vision and conditions such as cataracts can make you more likely to have a fall. If you wear glasses, make sure to get your prescription updated if your vision changes. Medicine review. Work with your health care provider to regularly review all of the medicines you are taking, including over-the-counter medicines. Ask your health care provider about any side effects that may make you more likely to have a fall. Tell your health care provider if any medicines that you take make you feel dizzy or sleepy. Strength and balance checks. Your health care provider may recommend certain tests to check your strength and balance while standing, walking, or changing positions. Foot health exam.  Foot pain and numbness, as well as not wearing proper footwear, can make you more likely to have a fall. Screenings, including: Osteoporosis screening. Osteoporosis is a condition that causes the bones to get weaker and break more easily. Blood pressure screening. Blood pressure changes and medicines to control blood pressure can make you feel dizzy. Depression screening. You may be more likely to have a fall if you have a fear of falling, feel depressed, or feel unable to do activities that you used to do. Alcohol use screening. Using too much alcohol can affect your balance and may make you more likely to have a fall. Follow these instructions at home: Lifestyle Do not drink alcohol if: Your health care provider tells you not to drink. If you drink alcohol: Limit how much you have to: 0-1 drink a day for women. 0-2 drinks a day for men. Know how much alcohol is in your drink. In the [place] one drink equals one 12 oz bottle of beer (355 mL), one 5 oz glass of wine (148 mL), or one 1 oz glass of hard liquor (44 mL). Do not use any products that contain nicotine or tobacco. These products include cigarettes, chewing tobacco, and vaping devices, such as e-cigarettes. If you need help quitting, ask your health care provider. Activity  Follow a regular exercise program to stay fit. This will help you maintain your balance. Ask your health care provider what types of exercise are appropriate for you. If you need a cane or walker, use it as recommended by your health care provider. Wear supportive shoes that have nonskid  soles. Safety  Remove any tripping hazards, such as rugs, cords, and clutter. Install safety equipment such as grab bars in bathrooms and safety rails on stairs. Keep rooms and walkways well-lit. General instructions Talk with your health care provider about your risks for falling. Tell your health care provider if: You fall. Be sure to tell your health care provider about all  falls, even ones that seem minor. You feel dizzy, tiredness (fatigue), or off-balance. Take over-the-counter and prescription medicines only as told by your health care provider. These include supplements. Eat a healthy diet and maintain a healthy weight. A healthy diet includes low-fat dairy products, low-fat (lean) meats, and fiber from whole grains, beans, and lots of fruits and vegetables. Stay current with your vaccines. Schedule regular health, dental, and eye exams. Summary Having a healthy lifestyle and getting preventive care can help to protect your health and wellness after age 69. Screening and testing are the best way to find a health problem early and help you avoid having a fall. Early diagnosis and treatment give you the best chance for managing medical conditions that are more common for people who are older than age 64. Falls are a major cause of broken bones and head injuries in people who are older than age 14. Take precautions to prevent a fall at home. Work with your health care provider to learn what changes you can make to improve your health and wellness and to prevent falls. This information is not intended to replace advice given to you by your health care provider. Make sure you discuss any questions you have with your health care provider. Document Revised: 08/29/2020 Document Reviewed: 08/29/2020 Elsevier Patient Education  2024 [employer].

## 2023-12-14 LAB — CBC WITH DIFFERENTIAL/PLATELET
Basophils Absolute: 0.1 x10E3/uL (ref 0.0–0.2)
Basos: 1 %
EOS (ABSOLUTE): 0.3 x10E3/uL (ref 0.0–0.4)
Eos: 3 %
Hematocrit: 46.4 % (ref 37.5–51.0)
Hemoglobin: 15.2 g/dL (ref 13.0–17.7)
Immature Grans (Abs): 0.1 x10E3/uL (ref 0.0–0.1)
Immature Granulocytes: 1 %
Lymphocytes Absolute: 2.5 x10E3/uL (ref 0.7–3.1)
Lymphs: 20 %
MCH: 31.9 pg (ref 26.6–33.0)
MCHC: 32.8 g/dL (ref 31.5–35.7)
MCV: 98 fL — ABNORMAL HIGH (ref 79–97)
Monocytes Absolute: 0.8 x10E3/uL (ref 0.1–0.9)
Monocytes: 6 %
Neutrophils Absolute: 9 x10E3/uL — ABNORMAL HIGH (ref 1.4–7.0)
Neutrophils: 69 %
Platelets: 237 x10E3/uL (ref 150–450)
RBC: 4.76 x10E6/uL (ref 4.14–5.80)
RDW: 13.7 % (ref 11.6–15.4)
WBC: 12.9 x10E3/uL — ABNORMAL HIGH (ref 3.4–10.8)

## 2023-12-14 LAB — CMP14+EGFR
ALT: 28 IU/L (ref 0–44)
AST: 24 IU/L (ref 0–40)
Albumin: 4.8 g/dL (ref 3.9–4.9)
Alkaline Phosphatase: 73 IU/L (ref 44–121)
BUN/Creatinine Ratio: 18 (ref 10–24)
BUN: 14 mg/dL (ref 8–27)
Bilirubin Total: 0.5 mg/dL (ref 0.0–1.2)
CO2: 21 mmol/L (ref 20–29)
Calcium: 9.6 mg/dL (ref 8.6–10.2)
Chloride: 101 mmol/L (ref 96–106)
Creatinine, Ser: 0.78 mg/dL (ref 0.76–1.27)
Globulin, Total: 2.1 g/dL (ref 1.5–4.5)
Glucose: 109 mg/dL — ABNORMAL HIGH (ref 70–99)
Potassium: 4 mmol/L (ref 3.5–5.2)
Sodium: 141 mmol/L (ref 134–144)
Total Protein: 6.9 g/dL (ref 6.0–8.5)
eGFR: 98 mL/min/1.73 (ref >59)

## 2023-12-14 LAB — PSA, TOTAL AND FREE
PSA, Free Pct: 43.3 %
PSA, Free: 0.52 ng/mL
Prostate Specific Ag, Serum: 1.2 ng/mL (ref 0.0–4.0)

## 2023-12-14 LAB — LIPID PANEL
Chol/HDL Ratio: 5.7 ratio — ABNORMAL HIGH (ref 0.0–5.0)
Cholesterol, Total: 246 mg/dL — ABNORMAL HIGH (ref 100–199)
HDL: 43 mg/dL (ref >39)
LDL Chol Calc (NIH): 163 mg/dL — ABNORMAL HIGH (ref 0–99)
Triglycerides: 213 mg/dL — ABNORMAL HIGH (ref 0–149)
VLDL Cholesterol Cal: 40 mg/dL (ref 5–40)

## 2023-12-14 LAB — VITAMIN D 25 HYDROXY (VIT D DEFICIENCY, FRACTURES): Vit D, 25-Hydroxy: 32.1 ng/mL (ref 30.0–100.0)

## 2023-12-14 LAB — TSH: TSH: 1.81 u[IU]/mL (ref 0.450–4.500)

## 2023-12-16 ENCOUNTER — Ambulatory Visit: Payer: Self-pay | Admitting: Physician Assistant

## 2023-12-16 NOTE — Progress Notes (Signed)
 Marcus Brock,   WBC stable.  Liver and kidney function look good.  Thyroid  normal.  PSA normal.  Vitamin D  low normal.  Fasting glucose elevated. Add A1C to better evaluate.   Cholesterol not to goal. Are you taking the lipitor daily? Your 10 year cardiovascular risk is really elevated.   Marcus Brock.The 10-year ASCVD risk score (Arnett DK, et al., 2019) is: 43.7%   Values used to calculate the score:     Age: 68 years     Clincally relevant sex: Male     Is Non-Hispanic African American: No     Diabetic: Yes     Tobacco smoker: No     Systolic Blood Pressure: 147 mmHg     Is BP treated: Yes     HDL Cholesterol: 43 mg/dL     Total Cholesterol: 246 mg/dL

## 2023-12-20 ENCOUNTER — Encounter: Payer: Self-pay | Admitting: Physician Assistant

## 2023-12-24 ENCOUNTER — Encounter: Payer: Self-pay | Admitting: Sports Medicine

## 2023-12-24 ENCOUNTER — Encounter: Payer: Self-pay | Admitting: Physician Assistant

## 2023-12-24 DIAGNOSIS — Z8719 Personal history of other diseases of the digestive system: Secondary | ICD-10-CM | POA: Insufficient documentation

## 2023-12-24 DIAGNOSIS — K4091 Unilateral inguinal hernia, without obstruction or gangrene, recurrent: Secondary | ICD-10-CM | POA: Insufficient documentation

## 2023-12-24 DIAGNOSIS — E559 Vitamin D deficiency, unspecified: Secondary | ICD-10-CM | POA: Insufficient documentation

## 2024-01-01 ENCOUNTER — Ambulatory Visit

## 2024-01-01 DIAGNOSIS — R1032 Left lower quadrant pain: Secondary | ICD-10-CM | POA: Diagnosis not present

## 2024-01-01 DIAGNOSIS — N200 Calculus of kidney: Secondary | ICD-10-CM | POA: Diagnosis not present

## 2024-01-01 DIAGNOSIS — Z8719 Personal history of other diseases of the digestive system: Secondary | ICD-10-CM

## 2024-01-01 DIAGNOSIS — K5730 Diverticulosis of large intestine without perforation or abscess without bleeding: Secondary | ICD-10-CM | POA: Diagnosis not present

## 2024-01-01 DIAGNOSIS — K4091 Unilateral inguinal hernia, without obstruction or gangrene, recurrent: Secondary | ICD-10-CM | POA: Diagnosis not present

## 2024-01-01 DIAGNOSIS — Z9889 Other specified postprocedural states: Secondary | ICD-10-CM

## 2024-01-06 ENCOUNTER — Encounter (INDEPENDENT_AMBULATORY_CARE_PROVIDER_SITE_OTHER): Payer: Self-pay

## 2024-01-07 NOTE — Progress Notes (Signed)
 No hernia seen on CT. No acute findings. Tiny non obstructing stones in kidney.

## 2024-01-10 DIAGNOSIS — M5412 Radiculopathy, cervical region: Secondary | ICD-10-CM | POA: Diagnosis not present

## 2024-01-14 NOTE — Progress Notes (Signed)
 Marcus Brock                                          MRN: [id number]   01/14/2024   The [facility] Quality Team Specialist reviewed this patient medical record for the purposes of chart review for care gap closure. The following were reviewed: chart review for care gap closure-diabetic eye exam.    [facility] Quality Team

## 2024-01-29 ENCOUNTER — Telehealth: Payer: Self-pay

## 2024-01-29 NOTE — Telephone Encounter (Signed)
 PAP: Patient assistance application for Breztri  through [employer] (AZ&[employer]) has been mailed to pt's home address on file. Provider portion of application will be faxed to provider's office. FOR 2026 RENEWAL

## 2024-02-03 NOTE — Telephone Encounter (Signed)
 Received provider portion of PAP application Breztri  (AZ[employer]ME)

## 2024-02-04 ENCOUNTER — Encounter: Payer: Self-pay | Admitting: Physician Assistant

## 2024-02-05 NOTE — Telephone Encounter (Signed)
 Can you call patient to schedule?

## 2024-02-12 ENCOUNTER — Other Ambulatory Visit: Payer: Self-pay | Admitting: Pain Medicine

## 2024-02-12 DIAGNOSIS — M5412 Radiculopathy, cervical region: Secondary | ICD-10-CM

## 2024-02-12 DIAGNOSIS — Z79899 Other long term (current) drug therapy: Secondary | ICD-10-CM | POA: Diagnosis not present

## 2024-02-23 ENCOUNTER — Ambulatory Visit (INDEPENDENT_AMBULATORY_CARE_PROVIDER_SITE_OTHER)

## 2024-02-23 DIAGNOSIS — M5412 Radiculopathy, cervical region: Secondary | ICD-10-CM | POA: Diagnosis not present

## 2024-03-02 DIAGNOSIS — M4802 Spinal stenosis, cervical region: Secondary | ICD-10-CM | POA: Diagnosis not present

## 2024-03-03 ENCOUNTER — Other Ambulatory Visit: Payer: Self-pay | Admitting: Neurological Surgery

## 2024-03-03 DIAGNOSIS — M5412 Radiculopathy, cervical region: Secondary | ICD-10-CM

## 2024-03-03 DIAGNOSIS — M4722 Other spondylosis with radiculopathy, cervical region: Secondary | ICD-10-CM

## 2024-03-03 DIAGNOSIS — M4802 Spinal stenosis, cervical region: Secondary | ICD-10-CM

## 2024-03-10 DIAGNOSIS — G894 Chronic pain syndrome: Secondary | ICD-10-CM | POA: Diagnosis not present

## 2024-03-11 ENCOUNTER — Ambulatory Visit (INDEPENDENT_AMBULATORY_CARE_PROVIDER_SITE_OTHER)

## 2024-03-11 DIAGNOSIS — M4802 Spinal stenosis, cervical region: Secondary | ICD-10-CM

## 2024-03-11 DIAGNOSIS — Z981 Arthrodesis status: Secondary | ICD-10-CM | POA: Diagnosis not present

## 2024-03-11 DIAGNOSIS — M5412 Radiculopathy, cervical region: Secondary | ICD-10-CM | POA: Diagnosis not present

## 2024-03-11 DIAGNOSIS — M47812 Spondylosis without myelopathy or radiculopathy, cervical region: Secondary | ICD-10-CM | POA: Diagnosis not present

## 2024-03-11 DIAGNOSIS — M4312 Spondylolisthesis, cervical region: Secondary | ICD-10-CM | POA: Diagnosis not present

## 2024-03-11 DIAGNOSIS — M4722 Other spondylosis with radiculopathy, cervical region: Secondary | ICD-10-CM

## 2024-03-13 ENCOUNTER — Other Ambulatory Visit: Payer: Self-pay | Admitting: Physician Assistant

## 2024-03-13 ENCOUNTER — Ambulatory Visit: Admitting: Physician Assistant

## 2024-03-13 DIAGNOSIS — E1140 Type 2 diabetes mellitus with diabetic neuropathy, unspecified: Secondary | ICD-10-CM

## 2024-03-13 DIAGNOSIS — E782 Mixed hyperlipidemia: Secondary | ICD-10-CM

## 2024-03-13 DIAGNOSIS — F411 Generalized anxiety disorder: Secondary | ICD-10-CM

## 2024-03-16 ENCOUNTER — Ambulatory Visit: Payer: Self-pay

## 2024-03-16 NOTE — Telephone Encounter (Signed)
 FYI Only or Action Required?: FYI only for provider: appointment scheduled on 10/25.  Patient was last seen in primary care on 12/13/2023 by Marcus Vermell CROME, PA-C.  Called Nurse Triage reporting Nasal Congestion (/), Cough, and Shortness of Breath.  Symptoms began several days ago.  Interventions attempted: OTC medications: Dayquil, robitussin and Prescription medications: Beztri, albuterol .  Symptoms are: gradually improving.  Triage Disposition: See Physician Within 24 Hours  Patient/caregiver understands and will follow disposition?: Yes  Copied from CRM [id number]. Topic: Clinical - Red Word Triage >> Mar 16, 2024 11:48 AM [name] wrote: [facility] that prompted transfer to Nurse Triage: Pt is exepriencing bad chest congestion, choking when trying to cough phlegm (dark yellow), and difficulty breathing. Warm transfer to NT. Reason for Disposition  SEVERE coughing spells (e.g., whooping sound after coughing, vomiting after coughing)  [1] Known COPD or other severe lung disease (i.e., bronchiectasis, cystic fibrosis, lung surgery) AND [2] symptoms getting worse (i.e., increased sputum purulence or amount, increased breathing difficulty  Answer Assessment - Initial Assessment Questions Pt reports onset on cough with chest and sinus congestion on Friday. Mildly productive with thick yellow sputum. Denies fever or coughing up blood. Pt reports hx of COPD with mild SOB at baseline. Reports mild increase in SOB with mild intermittent wheezing when trying to cough up phlegm. Reports it feels like he chokes breifly when trying to clear phlegm. Improved with prn neb txs, beztri, dayquil and robitussin. Pt reports symptoms were more severe a few days ago and was having to use albuterol  inhaler 6x/day but have improved and no longer using albuterol  inhaler. Reports experiencing similar symptoms multiple times in the past that was treated with z-pack. Offered in person appt tomorrow, declines stating  he's afraid to drive in case symptoms flair while driving. Scheduled virtual visit with provider at different office within pt region tomorrow d/t no availability at home office within timeframe. Advised UC or ED for worsening symptoms.   1. ONSET: When did the cough begin?      Friday  2. SEVERITY: How bad is the cough today?      Fluctuates between mild-severe. Feels like he chokes on small amount of thick sputum with wheezing.  3. SPUTUM: Describe the color of your sputum (e.g., none, dry cough; clear, white, yellow, green)     Small amount of thick yellow sputum  4. HEMOPTYSIS: Are you coughing up any blood? If Yes, ask: How much? (e.g., flecks, streaks, tablespoons, etc.)     Denies  5. DIFFICULTY BREATHING: Are you having difficulty breathing? If Yes, ask: How bad is it? (e.g., mild, moderate, severe)      Has mild SOB at baseline, slightly increased   6. FEVER: Do you have a fever? If Yes, ask: What is your temperature, how was it measured, and when did it start?     Denies  7. CARDIAC HISTORY: Do you have any history of heart disease? (e.g., heart attack, congestive heart failure)      Denies   8. LUNG HISTORY: Do you have any history of lung disease?  (e.g., pulmonary embolus, asthma, emphysema)     COPD  9. PE RISK FACTORS: Do you have a history of blood clots? (or: recent major surgery, recent prolonged travel, bedridden)     Denies  10. OTHER SYMPTOMS: Do you have any other symptoms? (e.g., runny nose, wheezing, chest pain)       Intermittent wheezing  Protocols used: Cough - Acute Productive-A-AH

## 2024-03-17 ENCOUNTER — Encounter: Payer: Self-pay | Admitting: Family Medicine

## 2024-03-17 ENCOUNTER — Telehealth (INDEPENDENT_AMBULATORY_CARE_PROVIDER_SITE_OTHER): Admitting: Family Medicine

## 2024-03-17 VITALS — BP 126/69 | HR 99

## 2024-03-17 DIAGNOSIS — J441 Chronic obstructive pulmonary disease with (acute) exacerbation: Secondary | ICD-10-CM

## 2024-03-17 DIAGNOSIS — R051 Acute cough: Secondary | ICD-10-CM | POA: Diagnosis not present

## 2024-03-17 MED ORDER — PREDNISONE 20 MG PO TABS: 40.0000 mg | ORAL_TABLET | Freq: Every day | ORAL | 0 refills | Status: DC

## 2024-03-17 MED ORDER — DOXYCYCLINE HYCLATE 100 MG PO TABS: 100.0000 mg | ORAL_TABLET | Freq: Two times a day (BID) | ORAL | 0 refills | Status: DC

## 2024-03-17 MED ORDER — BENZONATATE 200 MG PO CAPS: 200.0000 mg | ORAL_CAPSULE | Freq: Two times a day (BID) | ORAL | 0 refills | Status: DC | PRN

## 2024-03-17 NOTE — Patient Instructions (Addendum)
 Return in about 2 weeks (around 03/31/2024), or if symptoms worsen or fail to improve, for pcp.        Great to see you today.  I have refilled the medication(s) we provide.   If labs were collected or images ordered, we will inform you of  results once we have received them and reviewed. We will contact you either by echart message, or telephone call.  Please give ample time to the testing facility, and our office to run,  receive and review results. Please do not call inquiring of results, even if you can see them in your chart. We will contact you as soon as we are able. If it has been over 1 week since the test was completed, and you have not yet heard from us , then please call us .    - echart message- for normal results that have been seen by the patient already.   - telephone call: abnormal results or if patient has not viewed results in their echart.  If a referral to a specialist was entered for you, please call us  in 2 weeks if you have not heard from the specialist office to schedule.

## 2024-03-17 NOTE — Progress Notes (Signed)
 VIRTUAL VISIT VIA VIDEO  I connected with Marcus Brock on 03/17/24 at 10:20 AM EST by a video enabled telemedicine application and verified that I am speaking with the correct person using two identifiers. Location patient: Home Location provider: [facility], Office Persons participating in the virtual visit: Patient, Dr. Catherine and ALONSO Sharps, CMA  I discussed the limitations of evaluation and management by telemedicine and the availability of in person appointments. The patient expressed understanding and agreed to proceed.      Marcus Brock , March 18, 1956, 68 y.o., male MRN: [id number] Patient Care Team    Relationship Specialty Notifications Start End  Marcus Bernice RAMAN, [place] (Inactive) PCP - General Family Medicine  04/04/23     Chief Complaint  Patient presents with   Cough    11/22     Subjective: Marcus Brock is a 68 y.o. Pt presents for an OV with complaints of cough of 3 days duration.  Associated symptoms include sputum production, wheezing and headache.  He reports frontal sinus pain.  He states he had a similar infection last January.  Patient has a history of COPD, requiring more frequent albuterol  use during this illness.  He denies shortness of breath-except for when he is going through a coughing fit. He reports compliance with inhaler use. See ROS     12/13/2023   11:35 AM 02/26/2023    2:26 PM 12/05/2022    4:30 PM  Depression screen PHQ 2/9  Decreased Interest 2 1 3   Down, Depressed, Hopeless 2 1 3   PHQ - 2 Score 4 2 6   Altered sleeping 0 3 2  Tired, decreased energy 1 1 2   Change in appetite 0 0 1  Feeling bad or failure about yourself  1 0 2  Trouble concentrating 0 1 1  Moving slowly or fidgety/restless 1 0 2  Suicidal thoughts 0 0 0  PHQ-9 Score 7  7  16    Difficult doing work/chores Not difficult at all Not difficult at all Very difficult     Data saved with a previous flowsheet row definition    Allergies  Allergen Reactions    Penicillins Other (See Comments)    Received as newborn for ear infection and mother said it was a bad reaction   Gabapentin      Memory issues.    Wasp Venom Hives   Social History   Social History Narrative   Lives with his eldest son. He has two sons. He enjoys watching sports.   Past Medical History:  Diagnosis Date   Allergy 1980   Anxiety 1982   Arthritis 1995   Asthma    COPD (chronic obstructive pulmonary disease) (HCC) 2024   Depression 1982   Diabetes mellitus without complication (HCC)    Diabetic neuropathy (HCC)    feet   GERD (gastroesophageal reflux disease) 1992   History of hiatal hernia    Hypertension    Neuromuscular disorder (HCC)    Neuropathy bilateral arms from cervical spine   Pneumonia    Ulcer 1988   Past Surgical History:  Procedure Laterality Date   ANKLE SURGERY Left    As a child. Almost sliced in half   ANTERIOR CERVICAL DECOMP/DISCECTOMY FUSION N/A 07/31/2023   Procedure: ANTERIOR CERVICAL DECOMPRESSION/DISCECTOMY FUSION 1 LEVEL;  Surgeon: Beuford Anes, MD;  Location: [facility] OR;  Service: Orthopedics;  Laterality: N/A;  ANTERIOR CERVICAL DECOMPRESSION FUSION CERVICAL 3- CERVICAL 4 WITH INSTRUMENTATION AND ALLOGRAFT   APPENDECTOMY  01/2017  CHOLECYSTECTOMY  1995   COLON RESECTION N/A 04/2003   COLONOSCOPY  07/2002   12/2009; 12/2014   GANGLION CYST EXCISION Left 01/2002   left elbow   INGUINAL HERNIA REPAIR Right 2024   KNEE ARTHROSCOPY WITH MEDIAL MENISECTOMY Left 07/2017   NECK SURGERY  01/2014   NEVUS EXCISION     Back   ROTATOR CUFF REPAIR Right 12/2001   SPINE SURGERY  2018   Cervical Neck Fusion   TOOTH EXTRACTION     15 teeth removed due to infection   VASECTOMY  1996   Family History  Problem Relation Age of Onset   Hypertension Other    Diabetes Other    Stroke Other    Colon cancer Other    Skin cancer Other    Prostate cancer Other    COPD Mother    Depression Mother    Diabetes Mother    Heart disease  Mother    Hypertension Mother    Cancer Father    COPD Father    Depression Father    Diabetes Father    Heart disease Father    Hypertension Father    Stroke Father    Cancer Maternal Grandfather    Early death Maternal Grandmother    Heart disease Maternal Grandmother    Early death Paternal Grandmother    Heart disease Paternal Grandmother    ADD / ADHD Son    Anxiety disorder Son    Birth defects Son    Depression Son    Learning disabilities Son    Early death Maternal Uncle    Heart disease Maternal Uncle    Early death Paternal Aunt    Allergies as of 03/17/2024       Reactions   Penicillins Other (See Comments)   Received as newborn for ear infection and mother said it was a bad reaction   Gabapentin     Memory issues.    Wasp Venom Hives        Medication List        Accurate as of March 17, 2024 10:08 AM. If you have any questions, ask your nurse or doctor.          STOP taking these medications    pregabalin  150 MG capsule Commonly known as: Lyrica  Stopped by: Charlies Bellini       TAKE these medications    acetaminophen  650 MG CR tablet Commonly known as: TYLENOL  Take 1 tablet (650 mg total) by mouth every 8 (eight) hours as needed for pain.   albuterol  108 (90 Base) MCG/ACT inhaler Commonly known as: VENTOLIN  HFA Inhale 1-2 puffs into the lungs every 6 (six) hours as needed for wheezing or shortness of breath.   ALPRAZolam  0.5 MG tablet Commonly known as: XANAX  Take 1 tablet (0.5 mg total) by mouth 2 (two) times daily as needed for anxiety.   aspirin EC 81 MG tablet Take 81 mg by mouth daily.   atorvastatin  40 MG tablet Commonly known as: LIPITOR Take 1 tablet (40 mg total) by mouth at bedtime.   benzonatate  200 MG capsule Commonly known as: TESSALON  Take 1 capsule (200 mg total) by mouth 2 (two) times daily as needed for cough. Started by: Yazmine Sorey   Breztri  Aerosphere 160-9-4.8 MCG/ACT Aero inhaler Generic drug:  budesonide-glycopyrrolate -formoterol Inhale 2 puffs into the lungs 2 (two) times daily.   doxycycline  100 MG tablet Commonly known as: VIBRA -TABS Take 1 tablet (100 mg total) by mouth 2 (two) times daily.  Started by: Charlies Bellini   DULoxetine  60 MG capsule Commonly known as: CYMBALTA  Take 1 capsule (60 mg total) by mouth 2 (two) times daily.   EPINEPHrine  0.3 mg/0.3 mL Soaj injection Commonly known as: EPI-PEN Inject 0.3 mg into the muscle as needed for anaphylaxis.   famotidine 20 MG tablet Commonly known as: PEPCID Take 20 mg by mouth daily as needed for heartburn or indigestion.   ipratropium-albuterol  0.5-2.5 (3) MG/3ML Soln Commonly known as: DUONEB Take 3 mLs by nebulization every 6 (six) hours as needed.   losartan  25 MG tablet Commonly known as: COZAAR  TAKE 1 TABLET (25 MG TOTAL) BY MOUTH DAILY.   meloxicam  15 MG tablet Commonly known as: MOBIC  One tab PO every 24 hours with a meal for 2 weeks, then once every 24 hours prn pain.   metFORMIN  500 MG tablet Commonly known as: GLUCOPHAGE  Take 1 tablet (500 mg total) by mouth daily with breakfast.   MULTIVITAMIN GUMMIES MENS PO Take 2 each by mouth daily.   nystatin  100000 UNIT/ML suspension Commonly known as: MYCOSTATIN  Take 5 mLs (500,000 Units total) by mouth 4 (four) times daily. Swish for 30 seconds and spit out.   predniSONE  20 MG tablet Commonly known as: DELTASONE  Take 2 tablets (40 mg total) by mouth daily with breakfast. Started by: Charlies Bellini   tamsulosin  0.4 MG Caps capsule Commonly known as: FLOMAX  Take 1 capsule (0.4 mg total) by mouth daily.   tiZANidine  4 MG tablet Commonly known as: ZANAFLEX  Take 1 tablet (4 mg total) by mouth every 6 (six) hours as needed for muscle spasms.   Vitamin D3 50 MCG (2000 UT) capsule Take 2,000 Units by mouth daily.        All past medical history, surgical history, allergies, family history, immunizations andmedications were updated in the EMR today and  reviewed under the history and medication portions of their EMR.     Review of Systems  Constitutional:  Positive for malaise/fatigue.  HENT:  Positive for sinus pain and sore throat. Negative for ear pain.   Eyes: Negative.   Respiratory:  Positive for cough, sputum production and wheezing.   Gastrointestinal:  Negative for abdominal pain, nausea and vomiting.  Musculoskeletal:  Negative for myalgias.  Neurological:  Positive for headaches. Negative for dizziness.  All other systems reviewed and are negative.  Negative, with the exception of above mentioned in HPI   Objective:  BP 126/69   Pulse 99   SpO2 95%  There is no height or weight on file to calculate BMI.  Physical Exam Vitals and nursing note reviewed.  Constitutional:      General: He is not in acute distress.    Appearance: Normal appearance. He is not toxic-appearing.  HENT:     Head: Normocephalic and atraumatic.  Eyes:     General: No scleral icterus.       Right eye: No discharge.        Left eye: No discharge.     Conjunctiva/sclera: Conjunctivae normal.  Pulmonary:     Effort: Pulmonary effort is normal.  Musculoskeletal:     Cervical back: Normal range of motion.  Skin:    Findings: No rash.  Neurological:     Mental Status: He is alert and oriented to person, place, and time. Mental status is at baseline.  Psychiatric:        Mood and Affect: Mood normal.        Behavior: Behavior normal.  Thought Content: Thought content normal.        Judgment: Judgment normal.      No results found. No results found. No results found for this or any previous visit (from the past 24 hours).  Assessment/Plan: Marcus Brock is a 68 y.o. male present for OV for  Acute cough COPD exacerbation (HCC) (Primary) Rest, hydrate.  mucinex (DM if cough), nettie pot or nasal saline.  Doxy bid prescribed, take until completed.  Prednisone  burst x 5 days Tessalon  Perles for cough If cough present it can  last up to 6-8 weeks.  F/U 2 weeks if not improved.   Reviewed expectations re: course of current medical issues. Discussed self-management of symptoms. Outlined signs and symptoms indicating need for more acute intervention. Patient verbalized understanding and all questions were answered. Patient received an After-Visit Summary.    No orders of the defined types were placed in this encounter.  Meds ordered this encounter  Medications   doxycycline  (VIBRA -TABS) 100 MG tablet    Sig: Take 1 tablet (100 mg total) by mouth 2 (two) times daily.    Dispense:  14 tablet    Refill:  0   predniSONE  (DELTASONE ) 20 MG tablet    Sig: Take 2 tablets (40 mg total) by mouth daily with breakfast.    Dispense:  10 tablet    Refill:  0   benzonatate  (TESSALON ) 200 MG capsule    Sig: Take 1 capsule (200 mg total) by mouth 2 (two) times daily as needed for cough.    Dispense:  20 capsule    Refill:  0   Referral Orders  No referral(s) requested today     Note is dictated utilizing voice recognition software. Although note has been proof read prior to signing, occasional typographical errors still can be missed. If any questions arise, please do not hesitate to call for verification.   electronically signed by:  Charlies Bellini, DO  [facility] - OR

## 2024-03-24 DIAGNOSIS — M4312 Spondylolisthesis, cervical region: Secondary | ICD-10-CM | POA: Diagnosis not present

## 2024-03-31 ENCOUNTER — Ambulatory Visit: Admitting: Physician Assistant

## 2024-03-31 ENCOUNTER — Encounter: Payer: Self-pay | Admitting: Physician Assistant

## 2024-03-31 ENCOUNTER — Encounter: Payer: Self-pay | Admitting: Emergency Medicine

## 2024-03-31 VITALS — BP 172/93 | HR 67 | Ht 71.0 in | Wt 182.0 lb

## 2024-03-31 DIAGNOSIS — M4322 Fusion of spine, cervical region: Secondary | ICD-10-CM

## 2024-03-31 DIAGNOSIS — M47816 Spondylosis without myelopathy or radiculopathy, lumbar region: Secondary | ICD-10-CM

## 2024-03-31 DIAGNOSIS — R051 Acute cough: Secondary | ICD-10-CM

## 2024-03-31 DIAGNOSIS — N400 Enlarged prostate without lower urinary tract symptoms: Secondary | ICD-10-CM

## 2024-03-31 DIAGNOSIS — Q761 Klippel-Feil syndrome: Secondary | ICD-10-CM

## 2024-03-31 DIAGNOSIS — M5412 Radiculopathy, cervical region: Secondary | ICD-10-CM

## 2024-03-31 DIAGNOSIS — Z1211 Encounter for screening for malignant neoplasm of colon: Secondary | ICD-10-CM

## 2024-03-31 DIAGNOSIS — K635 Polyp of colon: Secondary | ICD-10-CM | POA: Insufficient documentation

## 2024-03-31 DIAGNOSIS — E1140 Type 2 diabetes mellitus with diabetic neuropathy, unspecified: Secondary | ICD-10-CM

## 2024-03-31 DIAGNOSIS — J449 Chronic obstructive pulmonary disease, unspecified: Secondary | ICD-10-CM

## 2024-03-31 DIAGNOSIS — E782 Mixed hyperlipidemia: Secondary | ICD-10-CM

## 2024-03-31 DIAGNOSIS — I10 Essential (primary) hypertension: Secondary | ICD-10-CM

## 2024-03-31 MED ORDER — BREZTRI AEROSPHERE 160-9-4.8 MCG/ACT IN AERO: 2.0000 | INHALATION_SPRAY | Freq: Two times a day (BID) | RESPIRATORY_TRACT | 11 refills | Status: AC

## 2024-03-31 MED ORDER — LOSARTAN POTASSIUM 50 MG PO TABS: 50.0000 mg | ORAL_TABLET | Freq: Every day | ORAL | 0 refills | Status: DC

## 2024-03-31 MED ORDER — DULOXETINE HCL 60 MG PO CPEP: 60.0000 mg | ORAL_CAPSULE | Freq: Two times a day (BID) | ORAL | 1 refills | Status: AC

## 2024-03-31 MED ORDER — ATORVASTATIN CALCIUM 40 MG PO TABS: 40.0000 mg | ORAL_TABLET | Freq: Every day | ORAL | 3 refills | Status: AC

## 2024-03-31 MED ORDER — TAMSULOSIN HCL 0.4 MG PO CAPS: 0.4000 mg | ORAL_CAPSULE | Freq: Every day | ORAL | 3 refills | Status: AC

## 2024-03-31 NOTE — Progress Notes (Signed)
 Established Patient Office Visit  Subjective   Patient ID: Marcus Brock, male    DOB: 10/30/1955  Age: 68 y.o. MRN: [id number]  Chief Complaint  Patient presents with   Medical Management of Chronic Issues    HPI .SABRADiscussed the use of AI scribe software for clinical note transcription with the patient, who gave verbal consent to proceed.  History of Present Illness Marcus Brock is a 68 year old male with diabetes and hypertension who presents for diabetic follow-up and medication refills.  Glycemic control - Blood glucose levels stable with home readings around 125-128 mg/dL, especially after taking metformin  - Currently taking metformin , one pill daily - Diet is not strictly controlled; frequent intake of cake and cookies  Hypertension - Blood pressure frequently elevated, especially during medical visits - Symptoms of elevated blood pressure include blurriness and feeling 'jumpy' - Occasional readings over 200 mmHg - Currently taking one antihypertensive pill daily, but does not take medication before morning appointments  Recent respiratory infection - Recent episode of bronchitis and sinus infection - Treated with antibiotics and prednisone  - Expelled green and yellow mucus - Pus-like discharge from eyes - Severe headache during illness - Completed course of medication and symptoms have resolved  Chronic back pain and spinal disease - 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    Objective:     BP (!) 172/93   Pulse 67   Ht 5' 11 (1.803 m)   Wt 182 lb (82.6 kg)   SpO2 99%   BMI 25.38 kg/m  BP Readings from Last 3 Encounters:  03/31/24  (!) 172/93  03/17/24 126/69  12/13/23 (!) 147/84   Wt Readings from Last 3 Encounters:  03/31/24 182 lb (82.6 kg)  12/13/23 185 lb (83.9 kg)  09/04/23 189 lb (85.7 kg)      Physical Exam Constitutional:      Appearance: Normal appearance.  HENT:     Head: Normocephalic.  Cardiovascular:     Rate and Rhythm: Normal rate and regular rhythm.  Pulmonary:     Effort: Pulmonary effort is normal.     Comments: Coarse breath sounds, bilateral lower lungs.  Neurological:     General: No focal deficit present.     Mental Status: He is alert and oriented to person, place, and time.  Psychiatric:        Mood and Affect: Mood normal.       The 10-year ASCVD risk score (Arnett DK, et al., 2019) is: 53.4%    Assessment & Plan:  [name] was seen today for medical management of chronic issues.  Diagnoses and all orders for this visit:  Type 2 diabetes mellitus with diabetic neuropathy, without long-term current use of insulin  (HCC)  Acute cough  Cervical vertebral fusion -     DULoxetine  (CYMBALTA ) 60 MG capsule; Take 1 capsule (60 mg total) by mouth 2 (two) times daily.  Cervical radiculopathy -     DULoxetine  (CYMBALTA ) 60 MG capsule; Take 1 capsule (60 mg total) by mouth 2 (two) times daily.  Lumbar spondylosis -     DULoxetine  (CYMBALTA ) 60 MG capsule; Take 1 capsule (60 mg total) by mouth  2 (two) times daily.  Cervical fusion syndrome -     DULoxetine  (CYMBALTA ) 60 MG capsule; Take 1 capsule (60 mg total) by mouth 2 (two) times daily.  Mixed hyperlipidemia -     atorvastatin  (LIPITOR) 40 MG tablet; Take 1 tablet (40 mg total) by mouth at bedtime.  Polyp of colon, unspecified part of colon, unspecified type -     Ambulatory referral to Gastroenterology  Colon cancer screening -     Ambulatory referral to Gastroenterology  Chronic obstructive pulmonary disease, unspecified COPD type (HCC) -     budesonide-glycopyrrolate -formoterol (BREZTRI  AEROSPHERE)  160-9-4.8 MCG/ACT AERO inhaler; Inhale 2 puffs into the lungs 2 (two) times daily. -     Ambulatory Referral Lung Cancer Screening [facility]  Primary hypertension -     losartan  (COZAAR ) 50 MG tablet; Take 1 tablet (50 mg total) by mouth daily.  Benign prostatic hyperplasia without lower urinary tract symptoms -     tamsulosin  (FLOMAX ) 0.4 MG CAPS capsule; Take 1 capsule (0.4 mg total) by mouth daily.   Assessment & Plan Type 2 diabetes mellitus with diabetic neuropathy Diabetes well-controlled with A1c of 6.0. Dietary habits may affect glucose control. - Continue metformin  500 mg once daily. - Encouraged dietary modifications to reduce sugar and carbohydrate intake. - Needs to schedule annual eye appt - Foot exam UTD - On STATIN, labs UTD - Flu/covid/shingles UTD  Hypertension, uncontrolled Uncontrolled hypertension with home readings up to 200 mmHg. Symptoms include blurriness and feeling jumpy. Increased medication dosage due to elevated blood pressure. - Increased antihypertensive medication to cozaar  50mg  - Instructed to take 25 mg tablets as 1.5 tablets since there is some concern of BP dropping too low like it had in the past. With current BP measurement should be able to increase to the 50mg  daily.  - Advised to check blood pressure at home and maintain a log. - Scheduled nurse visit in two weeks to recheck blood pressure and adjust treatment.  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 MRI shows significant spinal damage, no surgery recommended. - Continue pain management with pain patch and Cymbalta . - Advised to avoid heavy lifting and protect the upper body. - Managed by pain clinic.   COPD - resolving COPD exacerbation, finished medications - former smoker - Continue Breztri   General health maintenance Due for colonoscopy and yearly eye exam. - Referred for colonoscopy with preferred provider in  [place]. - Advised to schedule a yearly eye exam. - Former smoker with COPD- lung cancer screening referral placed.      Marcus Fishburn, PA-C

## 2024-03-31 NOTE — Patient Instructions (Addendum)
 Colonoscopy referral placed for [facility].  Increased cozaar  to 50mg  recheck nurse visit in 2 week. Take medication before come and bring in any home checks.   Managing Your Hypertension Hypertension, also called high blood pressure, is when the force of the blood pressing against the walls of the arteries is too strong. Arteries are blood vessels that carry blood from your heart throughout your body. Hypertension forces the heart to work harder to pump blood and may cause the arteries to become narrow or stiff. Understanding blood pressure readings A blood pressure reading includes a higher number over a lower number: The first, or top, number is called the systolic pressure. It is a measure of the pressure in your arteries as your heart beats. The second, or bottom number, is called the diastolic pressure. It is a measure of the pressure in your arteries as the heart relaxes. For most people, a normal blood pressure is below 120/80. Your personal target blood pressure may vary depending on your medical conditions, your age, and other factors. Blood pressure is classified into four stages. Based on your blood pressure reading, your health care provider may use the following stages to determine what type of treatment you need, if any. Systolic pressure and diastolic pressure are measured in a unit called millimeters of mercury (mmHg). Normal Systolic pressure: below 120. Diastolic pressure: below 80. Elevated Systolic pressure: 120-129. Diastolic pressure: below 80. Hypertension stage 1 Systolic pressure: 130-139. Diastolic pressure: 80-89. Hypertension stage 2 Systolic pressure: 140 or above. Diastolic pressure: 90 or above. How can this condition affect me? Managing your hypertension is very important. Over time, hypertension can damage the arteries and decrease blood flow to parts of the body, including the brain, heart, and kidneys. Having untreated or uncontrolled hypertension can  lead to: A heart attack. A stroke. A weakened blood vessel (aneurysm). Heart failure. Kidney damage. Eye damage. Memory and concentration problems. Vascular dementia. What actions can I take to manage this condition? Hypertension can be managed by making lifestyle changes and possibly by taking medicines. Your health care provider will help you make a plan to bring your blood pressure within a normal range. You may be referred for counseling on a healthy diet and physical activity. Nutrition  Eat a diet that is high in fiber and potassium, and low in salt (sodium), added sugar, and fat. An example eating plan is called the DASH diet. DASH stands for Dietary Approaches to Stop Hypertension. To eat this way: Eat plenty of fresh fruits and vegetables. Try to fill one-half of your plate at each meal with fruits and vegetables. Eat whole grains, such as whole-wheat pasta, brown rice, or whole-grain bread. Fill about one-fourth of your plate with whole grains. Eat low-fat dairy products. Avoid fatty cuts of meat, processed or cured meats, and poultry with skin. Fill about one-fourth of your plate with lean proteins such as fish, chicken without skin, beans, eggs, and tofu. Avoid pre-made and processed foods. These tend to be higher in sodium, added sugar, and fat. Reduce your daily sodium intake. Many people with hypertension should eat less than 1,500 mg of sodium a day. Lifestyle  Work with your health care provider to maintain a healthy body weight or to lose weight. Ask what an ideal weight is for you. Get at least 30 minutes of exercise that causes your heart to beat faster (aerobic exercise) most days of the week. Activities may include walking, swimming, or biking. Include exercise to strengthen your muscles (resistance  exercise), such as weight lifting, as part of your weekly exercise routine. Try to do these types of exercises for 30 minutes at least 3 days a week. Do not use any products  that contain nicotine or tobacco. These products include cigarettes, chewing tobacco, and vaping devices, such as e-cigarettes. If you need help quitting, ask your health care provider. Control any long-term (chronic) conditions you have, such as high cholesterol or diabetes. Identify your sources of stress and find ways to manage stress. This may include meditation, deep breathing, or making time for fun activities. Alcohol use Do not drink alcohol if: Your health care provider tells you not to drink. You are pregnant, may be pregnant, or are planning to become pregnant. If you drink alcohol: Limit how much you have to: 0-1 drink a day for women. 0-2 drinks a day for men. Know how much alcohol is in your drink. In the [place] one drink equals one 12 oz bottle of beer (355 mL), one 5 oz glass of wine (148 mL), or one 1 oz glass of hard liquor (44 mL). Medicines Your health care provider may prescribe medicine if lifestyle changes are not enough to get your blood pressure under control and if: Your systolic blood pressure is 130 or higher. Your diastolic blood pressure is 80 or higher. Take medicines only as told by your health care provider. Follow the directions carefully. Blood pressure medicines must be taken as told by your health care provider. The medicine does not work as well when you skip doses. Skipping doses also puts you at risk for problems. Monitoring Before you monitor your blood pressure: Do not smoke, drink caffeinated beverages, or exercise within 30 minutes before taking a measurement. Use the bathroom and empty your bladder (urinate). Sit quietly for at least 5 minutes before taking measurements. Monitor your blood pressure at home as told by your health care provider. To do this: Sit with your back straight and supported. Place your feet flat on the floor. Do not cross your legs. Support your arm on a flat surface, such as a table. Make sure your upper arm is at heart  level. Each time you measure, take two or three readings one minute apart and record the results. You may also need to have your blood pressure checked regularly by your health care provider. General information Talk with your health care provider about your diet, exercise habits, and other lifestyle factors that may be contributing to hypertension. Review all the medicines you take with your health care provider because there may be side effects or interactions. Keep all follow-up visits. Your health care provider can help you create and adjust your plan for managing your high blood pressure. Where to find more information National Heart, Lung, and Blood Institute: popsteam.is American Heart Association: www.heart.org Contact a health care provider if: You think you are having a reaction to medicines you have taken. You have repeated (recurrent) headaches. You feel dizzy. You have swelling in your ankles. You have trouble with your vision. Get help right away if: You develop a severe headache or confusion. You have unusual weakness or numbness, or you feel faint. You have severe pain in your chest or abdomen. You vomit repeatedly. You have trouble breathing. These symptoms may be an emergency. Get help right away. Call 911. Do not wait to see if the symptoms will go away. Do not drive yourself to the hospital. Summary Hypertension is when the force of blood pumping through your arteries is  too strong. If this condition is not controlled, it may put you at risk for serious complications. Your personal target blood pressure may vary depending on your medical conditions, your age, and other factors. For most people, a normal blood pressure is less than 120/80. Hypertension is managed by lifestyle changes, medicines, or both. Lifestyle changes to help manage hypertension include losing weight, eating a healthy, low-sodium diet, exercising more, stopping smoking, and limiting  alcohol. This information is not intended to replace advice given to you by your health care provider. Make sure you discuss any questions you have with your health care provider. Document Revised: 12/22/2020 Document Reviewed: 12/22/2020 Elsevier Patient Education  2024 [employer].

## 2024-04-01 ENCOUNTER — Other Ambulatory Visit: Payer: Self-pay | Admitting: Physician Assistant

## 2024-04-01 ENCOUNTER — Ambulatory Visit

## 2024-04-01 DIAGNOSIS — I10 Essential (primary) hypertension: Secondary | ICD-10-CM

## 2024-04-13 NOTE — Progress Notes (Unsigned)
" ° °  Subjective:    Patient ID: Marcus Brock, male    DOB: April 21, 1956, 68 y.o.   MRN: [id number]  HPI  Patient is here for a 2 wk BP recheck. Last OV it read 172/93. Patients losartan  was increased from 25mg  to 50mg  daily. Denies SOB, CP, heart palpitations, or vision changes.  Review of Systems     Objective:   Physical Exam        Assessment & Plan:   Patients first BP is 154/88 second is 150/87. Reported to PCP vermell who would like pt to increase his losartan  to 75 mg and f/u in 2 weeks for BP check. Patient states he still has his 25 mg, so he is going to add that to the 50 mg to equal 75mg  "

## 2024-04-14 ENCOUNTER — Ambulatory Visit (INDEPENDENT_AMBULATORY_CARE_PROVIDER_SITE_OTHER)

## 2024-04-14 VITALS — BP 150/87 | HR 83 | Resp 19 | Ht 71.0 in | Wt 182.0 lb

## 2024-04-14 DIAGNOSIS — I10 Essential (primary) hypertension: Secondary | ICD-10-CM | POA: Diagnosis not present

## 2024-04-22 ENCOUNTER — Other Ambulatory Visit: Payer: Self-pay | Admitting: Physician Assistant

## 2024-04-22 DIAGNOSIS — I10 Essential (primary) hypertension: Secondary | ICD-10-CM

## 2024-04-27 NOTE — Progress Notes (Signed)
" ° °  Subjective:    Patient ID: Marcus Brock, male    DOB: 22-Jan-1956, 69 y.o.   MRN: [id number]  HPI  Patient is here for a 2 week BP recheck. Patient losartan  was increased from 50 mg to 75 mg last NV. Denies CP, SOB, heart palpitations, headache, or vision changes.  Review of Systems     Objective:   Physical Exam        Assessment & Plan:   Patients first BP is 147/85 second was 144/85. Reported to [facility] who is going to review his chart and make any neccessary changes and will contact the patient with any changes if any. Patient is also requesting refills, as he is now taking 1 1/2 of 50 mg make 75 mg. "

## 2024-04-28 ENCOUNTER — Ambulatory Visit

## 2024-04-28 VITALS — BP 144/85 | HR 72 | Resp 20 | Ht 71.0 in | Wt 182.0 lb

## 2024-04-28 DIAGNOSIS — I10 Essential (primary) hypertension: Secondary | ICD-10-CM

## 2024-05-01 ENCOUNTER — Other Ambulatory Visit: Payer: Self-pay

## 2024-05-01 DIAGNOSIS — I10 Essential (primary) hypertension: Secondary | ICD-10-CM

## 2024-05-01 MED ORDER — LOSARTAN POTASSIUM 50 MG PO TABS: 50.0000 mg | ORAL_TABLET | Freq: Two times a day (BID) | ORAL | 0 refills | Status: AC

## 2024-05-07 NOTE — Telephone Encounter (Signed)
 Reached out to patient multiple times via phone no response or call back. Will close encounter application has expired. For Breztri  ([place])

## 2024-05-13 ENCOUNTER — Encounter: Payer: Self-pay | Admitting: Physician Assistant

## 2024-05-13 MED ORDER — PREGABALIN 150 MG PO CAPS: 150.0000 mg | ORAL_CAPSULE | Freq: Two times a day (BID) | ORAL | 1 refills | Status: AC
